# Patient Record
Sex: Female | Born: 1970 | Race: White | Hispanic: No | Marital: Married | Smoking: Never smoker
Health system: Southern US, Community
[De-identification: ages and names within clinical notes are randomized; demographics above are authoritative.]

## PROBLEM LIST (undated history)

## (undated) DIAGNOSIS — N760 Acute vaginitis: Secondary | ICD-10-CM

## (undated) DIAGNOSIS — J40 Bronchitis, not specified as acute or chronic: Secondary | ICD-10-CM

## (undated) DIAGNOSIS — N3010 Interstitial cystitis (chronic) without hematuria: Secondary | ICD-10-CM

## (undated) DIAGNOSIS — Z87440 Personal history of urinary (tract) infections: Secondary | ICD-10-CM

## (undated) DIAGNOSIS — B9689 Other specified bacterial agents as the cause of diseases classified elsewhere: Secondary | ICD-10-CM

## (undated) HISTORY — DX: Personal history of urinary (tract) infections: Z87.440

## (undated) HISTORY — PX: NO PAST SURGERIES: SHX2092

## (undated) HISTORY — DX: Interstitial cystitis (chronic) without hematuria: N30.10

## (undated) HISTORY — DX: Other specified bacterial agents as the cause of diseases classified elsewhere: B96.89

## (undated) HISTORY — DX: Bronchitis, not specified as acute or chronic: J40

## (undated) HISTORY — DX: Acute vaginitis: N76.0

---

## 1998-09-16 ENCOUNTER — Other Ambulatory Visit: Admission: RE | Admit: 1998-09-16 | Discharge: 1998-09-16 | Payer: Self-pay | Admitting: *Deleted

## 2001-03-29 ENCOUNTER — Ambulatory Visit (HOSPITAL_COMMUNITY): Admission: RE | Admit: 2001-03-29 | Discharge: 2001-03-29 | Payer: Self-pay | Admitting: Obstetrics and Gynecology

## 2001-04-05 ENCOUNTER — Ambulatory Visit (HOSPITAL_COMMUNITY): Admission: RE | Admit: 2001-04-05 | Discharge: 2001-04-05 | Payer: Self-pay | Admitting: Obstetrics and Gynecology

## 2001-04-09 ENCOUNTER — Ambulatory Visit (HOSPITAL_COMMUNITY): Admission: AD | Admit: 2001-04-09 | Discharge: 2001-04-09 | Payer: Self-pay | Admitting: Obstetrics and Gynecology

## 2001-04-12 ENCOUNTER — Ambulatory Visit (HOSPITAL_COMMUNITY): Admission: RE | Admit: 2001-04-12 | Discharge: 2001-04-12 | Payer: Self-pay | Admitting: Obstetrics and Gynecology

## 2001-04-16 ENCOUNTER — Ambulatory Visit (HOSPITAL_COMMUNITY): Admission: AD | Admit: 2001-04-16 | Discharge: 2001-04-16 | Payer: Self-pay | Admitting: Obstetrics and Gynecology

## 2001-04-19 ENCOUNTER — Ambulatory Visit (HOSPITAL_COMMUNITY): Admission: RE | Admit: 2001-04-19 | Discharge: 2001-04-19 | Payer: Self-pay | Admitting: Obstetrics and Gynecology

## 2001-04-24 ENCOUNTER — Ambulatory Visit (HOSPITAL_COMMUNITY): Admission: RE | Admit: 2001-04-24 | Discharge: 2001-04-24 | Payer: Self-pay | Admitting: Obstetrics and Gynecology

## 2001-04-26 ENCOUNTER — Ambulatory Visit (HOSPITAL_COMMUNITY): Admission: AD | Admit: 2001-04-26 | Discharge: 2001-04-26 | Payer: Self-pay | Admitting: Obstetrics and Gynecology

## 2001-04-30 ENCOUNTER — Ambulatory Visit (HOSPITAL_COMMUNITY): Admission: RE | Admit: 2001-04-30 | Discharge: 2001-04-30 | Payer: Self-pay | Admitting: Obstetrics and Gynecology

## 2001-05-03 ENCOUNTER — Ambulatory Visit (HOSPITAL_COMMUNITY): Admission: RE | Admit: 2001-05-03 | Discharge: 2001-05-03 | Payer: Self-pay | Admitting: *Deleted

## 2001-05-07 ENCOUNTER — Ambulatory Visit (HOSPITAL_COMMUNITY): Admission: RE | Admit: 2001-05-07 | Discharge: 2001-05-07 | Payer: Self-pay | Admitting: Obstetrics and Gynecology

## 2001-05-10 ENCOUNTER — Ambulatory Visit (HOSPITAL_COMMUNITY): Admission: AD | Admit: 2001-05-10 | Discharge: 2001-05-10 | Payer: Self-pay | Admitting: Obstetrics and Gynecology

## 2001-05-15 ENCOUNTER — Ambulatory Visit (HOSPITAL_COMMUNITY): Admission: AD | Admit: 2001-05-15 | Discharge: 2001-05-15 | Payer: Self-pay | Admitting: Obstetrics and Gynecology

## 2001-05-17 ENCOUNTER — Ambulatory Visit (HOSPITAL_COMMUNITY): Admission: RE | Admit: 2001-05-17 | Discharge: 2001-05-17 | Payer: Self-pay | Admitting: Obstetrics and Gynecology

## 2001-05-22 ENCOUNTER — Ambulatory Visit (HOSPITAL_COMMUNITY): Admission: RE | Admit: 2001-05-22 | Discharge: 2001-05-22 | Payer: Self-pay | Admitting: Obstetrics and Gynecology

## 2001-05-29 ENCOUNTER — Ambulatory Visit (HOSPITAL_COMMUNITY): Admission: AD | Admit: 2001-05-29 | Discharge: 2001-05-29 | Payer: Self-pay | Admitting: Obstetrics and Gynecology

## 2001-05-30 ENCOUNTER — Encounter: Payer: Self-pay | Admitting: Obstetrics and Gynecology

## 2001-05-30 ENCOUNTER — Ambulatory Visit (HOSPITAL_COMMUNITY): Admission: RE | Admit: 2001-05-30 | Discharge: 2001-05-30 | Payer: Self-pay | Admitting: Obstetrics and Gynecology

## 2001-06-05 ENCOUNTER — Ambulatory Visit (HOSPITAL_COMMUNITY): Admission: AD | Admit: 2001-06-05 | Discharge: 2001-06-05 | Payer: Self-pay | Admitting: Obstetrics and Gynecology

## 2001-06-07 ENCOUNTER — Ambulatory Visit (HOSPITAL_COMMUNITY): Admission: RE | Admit: 2001-06-07 | Discharge: 2001-06-07 | Payer: Self-pay | Admitting: Obstetrics and Gynecology

## 2001-06-11 ENCOUNTER — Ambulatory Visit (HOSPITAL_COMMUNITY): Admission: AD | Admit: 2001-06-11 | Discharge: 2001-06-11 | Payer: Self-pay | Admitting: Obstetrics and Gynecology

## 2003-03-20 ENCOUNTER — Encounter: Payer: Self-pay | Admitting: Internal Medicine

## 2003-03-20 ENCOUNTER — Ambulatory Visit (HOSPITAL_COMMUNITY): Admission: RE | Admit: 2003-03-20 | Discharge: 2003-03-20 | Payer: Self-pay | Admitting: Internal Medicine

## 2003-06-26 ENCOUNTER — Ambulatory Visit (HOSPITAL_COMMUNITY): Admission: RE | Admit: 2003-06-26 | Discharge: 2003-06-26 | Payer: Self-pay | Admitting: Internal Medicine

## 2007-09-18 ENCOUNTER — Other Ambulatory Visit: Admission: RE | Admit: 2007-09-18 | Discharge: 2007-09-18 | Payer: Self-pay | Admitting: Obstetrics and Gynecology

## 2008-06-23 ENCOUNTER — Ambulatory Visit (HOSPITAL_COMMUNITY): Admission: RE | Admit: 2008-06-23 | Discharge: 2008-06-23 | Payer: Self-pay | Admitting: Obstetrics & Gynecology

## 2008-12-11 ENCOUNTER — Ambulatory Visit (HOSPITAL_COMMUNITY): Admission: RE | Admit: 2008-12-11 | Discharge: 2008-12-11 | Payer: Self-pay | Admitting: Obstetrics & Gynecology

## 2010-02-22 ENCOUNTER — Other Ambulatory Visit: Admission: RE | Admit: 2010-02-22 | Discharge: 2010-02-22 | Payer: Self-pay | Admitting: Obstetrics and Gynecology

## 2010-12-29 ENCOUNTER — Other Ambulatory Visit (HOSPITAL_COMMUNITY): Payer: Self-pay | Admitting: Internal Medicine

## 2010-12-29 ENCOUNTER — Ambulatory Visit (HOSPITAL_COMMUNITY)
Admission: RE | Admit: 2010-12-29 | Discharge: 2010-12-29 | Disposition: A | Discharge status: Home / Self Care | Payer: 59 | Source: Ambulatory Visit | Attending: Internal Medicine | Admitting: Internal Medicine

## 2010-12-29 DIAGNOSIS — M25519 Pain in unspecified shoulder: Secondary | ICD-10-CM | POA: Insufficient documentation

## 2010-12-29 DIAGNOSIS — M898X1 Other specified disorders of bone, shoulder: Secondary | ICD-10-CM

## 2010-12-29 DIAGNOSIS — R0789 Other chest pain: Secondary | ICD-10-CM | POA: Insufficient documentation

## 2011-04-08 NOTE — Op Note (Signed)
NAME:  Terri Tyler, Terri Tyler                        ACCOUNT NO.:  [id number]   MEDICAL RECORD NO.:  [id number]                   PATIENT TYPE:  AMB   LOCATION:  DAY                                  FACILITY:  [facility]   PHYSICIAN:  Lionel December, M.D.                 DATE OF BIRTH:  07-25-1971   DATE OF PROCEDURE:  06/27/2003  DATE OF DISCHARGE:                                 OPERATIVE REPORT   PROCEDURE:  Esophagogastroduodenoscopy.   INDICATIONS:  Terri Tyler is a 40 year old Caucasian female with history of  heartburn who is also having retrosternal chest pain radiating to her neck  and arm.  She has been on Prilosec but symptoms have not resolved.  She had  a normal EKG and echocardiography.  She is undergoing diagnostic EGD.  The  procedure risks were reviewed with the patient and informed consent was  obtained.   PREMEDICATION:  Cetacaine spray for pharyngeal topical anesthesia, Demerol  50 mg IV, Versed 10 mg IV in divided dose.   FINDINGS:  Procedure performed in endoscopy suite.  The patient's vital  signs and O2 saturation were monitored during the procedure and remained  stable.  The patient was placed in the left lateral recumbent position and  the Olympus video scope was passed via oropharynx without any difficulty  into the esophagus.   Esophagus:  Mucosa of the esophagus was normal.  The squamocolumnar junction  was very wavy or serrated.  There was a 3 cm long sliding hiatal hernia.  No  ring or stricture was noted.   Stomach:  It was empty and distended very well with insufflation.  Folds of  the proximal stomach were normal.  Examination of the mucosa at body,  antrum, pyloric channel, as well as angularis, fundus, and cardia, was  normal.   Duodenum:  Examination of the bulb reveals normal mucosa.  The scope was  passed to the second part of the duodenum.  The mucosa and folds are normal.  The endoscope was withdrawn.  The patient tolerated the procedure well.   FINAL DIAGNOSIS:  Small sliding hiatal hernia, otherwise normal EGD.   Cannot rule out chest pain secondary to gastroesophageal reflux.   RECOMMENDATIONS:  1. Antireflux measured reinforced.  She definitely has room for improvement.  2. Will stop her Prilosec and start her on Nexium 40 mg p.o. q.a.m.  3.     If she does not improve significantly with these measures, would consider     upper abdominal ultrasound and esophageal manometry and a pH study.  4. The patient will call with a progress report in two weeks from now.                                               [employer],  M.D.    NR/MEDQ  D:  06/26/2003  T:  06/27/2003  Job:  [id number]   cc:   Kingsley Callander. Ouida Sills, M.D.  [address]  [place]  [place] [phone]  Fax: [phone]

## 2011-12-12 ENCOUNTER — Other Ambulatory Visit (HOSPITAL_COMMUNITY)
Admission: RE | Admit: 2011-12-12 | Discharge: 2011-12-12 | Disposition: A | Discharge status: Home / Self Care | Payer: 59 | Source: Ambulatory Visit | Attending: Obstetrics & Gynecology | Admitting: Obstetrics & Gynecology

## 2011-12-12 DIAGNOSIS — Z01419 Encounter for gynecological examination (general) (routine) without abnormal findings: Secondary | ICD-10-CM | POA: Insufficient documentation

## 2013-02-05 ENCOUNTER — Other Ambulatory Visit: Payer: Self-pay | Admitting: Obstetrics & Gynecology

## 2013-03-13 ENCOUNTER — Encounter: Payer: Self-pay | Admitting: *Deleted

## 2013-03-14 ENCOUNTER — Encounter: Payer: Self-pay | Admitting: Obstetrics & Gynecology

## 2013-03-14 ENCOUNTER — Ambulatory Visit (INDEPENDENT_AMBULATORY_CARE_PROVIDER_SITE_OTHER): Payer: BC Managed Care – HMO | Admitting: Obstetrics & Gynecology

## 2013-03-14 VITALS — BP 128/78 | Ht 64.0 in | Wt 276.0 lb

## 2013-03-14 DIAGNOSIS — N3010 Interstitial cystitis (chronic) without hematuria: Secondary | ICD-10-CM

## 2013-03-14 MED ORDER — PREDNISONE (PAK) 10 MG PO TABS: 10.0000 mg | ORAL_TABLET | Freq: Every day | ORAL | Status: DC

## 2013-03-14 MED ORDER — FEXOFENADINE-PSEUDOEPHED ER 180-240 MG PO TB24: 1.0000 | ORAL_TABLET | Freq: Every day | ORAL | Status: DC

## 2013-03-14 NOTE — Progress Notes (Signed)
Patient ID: Terri Tyler, female   DOB: Apr 20, 1971, 42 y.o.   MRN: [id number] DMSO bladder irrigation performed Allergies are really flaring up Prednisone x 10 days Zyrtec d Allergy drops for eye

## 2013-03-14 NOTE — Patient Instructions (Addendum)

## 2013-03-18 ENCOUNTER — Telehealth: Payer: Self-pay | Admitting: Adult Health

## 2013-03-18 ENCOUNTER — Telehealth: Payer: Self-pay | Admitting: Obstetrics & Gynecology

## 2013-03-18 NOTE — Telephone Encounter (Signed)
Invalid number

## 2013-03-18 NOTE — Telephone Encounter (Signed)
Left message I called 

## 2013-03-18 NOTE — Telephone Encounter (Signed)
Pt called and wants antibiotic for cold and to say the prednisone keeps her awake. Told her not to take prednisone in am and to talk with Dr. Despina Hidden in am about the antibiotic and prednisone, she said ok.

## 2013-03-19 NOTE — Telephone Encounter (Signed)
No antibiotic needed its allergies

## 2013-04-10 ENCOUNTER — Ambulatory Visit: Payer: BC Managed Care – HMO | Admitting: Obstetrics & Gynecology

## 2013-05-06 ENCOUNTER — Ambulatory Visit (INDEPENDENT_AMBULATORY_CARE_PROVIDER_SITE_OTHER): Payer: BC Managed Care – HMO | Admitting: Obstetrics & Gynecology

## 2013-05-06 ENCOUNTER — Encounter: Payer: Self-pay | Admitting: Obstetrics & Gynecology

## 2013-05-06 VITALS — BP 110/80 | Wt 280.0 lb

## 2013-05-06 DIAGNOSIS — N3010 Interstitial cystitis (chronic) without hematuria: Secondary | ICD-10-CM

## 2013-05-06 MED ORDER — UROGESIC-BLUE 81.6 MG PO TABS: 1.0000 | ORAL_TABLET | Freq: Three times a day (TID) | ORAL | Status: DC

## 2013-05-06 NOTE — Progress Notes (Signed)
Patient ID: Terri Tyler, female   DOB: 1971/11/14, 42 y.o.   MRN: [id number] Long standing interstitial cystitis in today for her periodic maintainence bladder irrigation Last time 3 weeks ago  DMSO irrigation preformed in usual fashion, 50 cc total  Follow up in 2 weeks

## 2013-05-27 ENCOUNTER — Ambulatory Visit (INDEPENDENT_AMBULATORY_CARE_PROVIDER_SITE_OTHER): Payer: BC Managed Care – HMO | Admitting: Obstetrics & Gynecology

## 2013-05-27 ENCOUNTER — Encounter: Payer: Self-pay | Admitting: Obstetrics & Gynecology

## 2013-05-27 VITALS — BP 110/80 | Wt 227.0 lb

## 2013-05-27 DIAGNOSIS — N3010 Interstitial cystitis (chronic) without hematuria: Secondary | ICD-10-CM

## 2013-05-27 NOTE — Patient Instructions (Signed)

## 2013-05-27 NOTE — Progress Notes (Signed)
Patient ID: Terri Tyler, female   DOB: 1971-01-04, 42 y.o.   MRN: [id number] Terri Tyler is in today for a routine DMSO irrigation She had her last one 3 weeks ago  She is having some complaints She is taking her urelle  50 cc of DMSO is instilled today without difficulty  We will see her back in 2 weeks for reinstallation per her request

## 2013-06-11 ENCOUNTER — Ambulatory Visit: Payer: BC Managed Care – HMO | Admitting: Obstetrics & Gynecology

## 2013-06-11 ENCOUNTER — Encounter: Payer: Self-pay | Admitting: *Deleted

## 2013-06-12 ENCOUNTER — Ambulatory Visit: Payer: BC Managed Care – HMO | Admitting: Obstetrics & Gynecology

## 2013-07-04 ENCOUNTER — Ambulatory Visit: Payer: BC Managed Care – HMO | Admitting: Obstetrics & Gynecology

## 2013-07-09 ENCOUNTER — Ambulatory Visit: Payer: BC Managed Care – HMO | Admitting: Obstetrics & Gynecology

## 2013-09-10 ENCOUNTER — Ambulatory Visit: Payer: BC Managed Care – HMO | Admitting: Obstetrics & Gynecology

## 2013-11-18 ENCOUNTER — Other Ambulatory Visit (HOSPITAL_COMMUNITY): Payer: Self-pay | Admitting: Internal Medicine

## 2013-11-18 DIAGNOSIS — Z139 Encounter for screening, unspecified: Secondary | ICD-10-CM

## 2013-11-19 ENCOUNTER — Ambulatory Visit (HOSPITAL_COMMUNITY)
Admission: RE | Admit: 2013-11-19 | Discharge: 2013-11-19 | Disposition: A | Discharge status: Home / Self Care | Payer: BC Managed Care – PPO | Source: Ambulatory Visit | Attending: Internal Medicine | Admitting: Internal Medicine

## 2013-11-19 DIAGNOSIS — Z139 Encounter for screening, unspecified: Secondary | ICD-10-CM

## 2013-11-19 DIAGNOSIS — Z1231 Encounter for screening mammogram for malignant neoplasm of breast: Secondary | ICD-10-CM | POA: Insufficient documentation

## 2014-02-25 ENCOUNTER — Ambulatory Visit (INDEPENDENT_AMBULATORY_CARE_PROVIDER_SITE_OTHER): Payer: BC Managed Care – HMO | Admitting: Obstetrics & Gynecology

## 2014-02-25 ENCOUNTER — Encounter: Payer: Self-pay | Admitting: Obstetrics & Gynecology

## 2014-02-25 VITALS — BP 120/80 | Ht 63.0 in | Wt 269.0 lb

## 2014-02-25 DIAGNOSIS — N3010 Interstitial cystitis (chronic) without hematuria: Secondary | ICD-10-CM

## 2014-02-25 MED ORDER — PENTOSAN POLYSULFATE SODIUM 100 MG PO CAPS: ORAL_CAPSULE | ORAL | Status: DC

## 2014-02-25 NOTE — Progress Notes (Signed)
Patient ID: Terri Tyler, female   DOB: 12/07/1970, 43 y.o.   MRN: [id number] Pt has not been in since 05/27/2013 for DMSO Pt symptoms have returned due to allergy season  50 cc of DMSO instilled  Follow up 2 months \ Past Medical History  Diagnosis Date  . Interstitial cystitis   . Bronchitis   . BV (bacterial vaginosis)   . History of UTI     Past Surgical History  Procedure Laterality Date  . No past surgeries      OB History   Grav Para Term Preterm Abortions TAB SAB Ect Mult Living   3 3 2      1 3       Allergies  Allergen Reactions  . Other     novacaine-dizziness     History   Social History  . Marital Status: Married    Spouse Name: N/A    Number of Children: N/A  . Years of Education: N/A   Social History Main Topics  . Smoking status: Never Smoker   . Smokeless tobacco: None  . Alcohol Use: No  . Drug Use: No  . Sexual Activity: Yes   Other Topics Concern  . None   Social History Narrative  . None    Family History  Problem Relation Age of Onset  . Colon cancer Paternal Grandfather   . Diabetes Paternal Grandmother   . Colon cancer Maternal Grandmother   . Hypertension Father

## 2014-02-28 ENCOUNTER — Other Ambulatory Visit: Payer: Self-pay | Admitting: Obstetrics & Gynecology

## 2014-04-02 ENCOUNTER — Ambulatory Visit (HOSPITAL_COMMUNITY)
Admission: RE | Admit: 2014-04-02 | Discharge: 2014-04-02 | Disposition: A | Discharge status: Home / Self Care | Payer: BC Managed Care – PPO | Source: Ambulatory Visit | Attending: Pulmonary Disease | Admitting: Pulmonary Disease

## 2014-04-02 ENCOUNTER — Other Ambulatory Visit (HOSPITAL_COMMUNITY): Payer: Self-pay | Admitting: Pulmonary Disease

## 2014-04-02 DIAGNOSIS — R200 Anesthesia of skin: Secondary | ICD-10-CM

## 2014-04-02 DIAGNOSIS — R209 Unspecified disturbances of skin sensation: Secondary | ICD-10-CM | POA: Insufficient documentation

## 2014-04-02 DIAGNOSIS — R030 Elevated blood-pressure reading, without diagnosis of hypertension: Secondary | ICD-10-CM | POA: Insufficient documentation

## 2014-04-02 DIAGNOSIS — R531 Weakness: Secondary | ICD-10-CM

## 2014-04-02 DIAGNOSIS — R5381 Other malaise: Secondary | ICD-10-CM | POA: Insufficient documentation

## 2014-04-02 DIAGNOSIS — R5383 Other fatigue: Secondary | ICD-10-CM

## 2014-04-04 ENCOUNTER — Telehealth: Payer: Self-pay | Admitting: Obstetrics & Gynecology

## 2014-04-04 MED ORDER — CITALOPRAM HYDROBROMIDE 40 MG PO TABS: ORAL_TABLET | ORAL | Status: DC

## 2014-04-04 NOTE — Telephone Encounter (Signed)
Pt requesting Celexa 20 mg to be increased to 40 mg due to increased anxiety or does pt need an appt. Next appt for bladder irrigation 04/28/2014.

## 2014-04-04 NOTE — Telephone Encounter (Signed)
[facility] [place].

## 2014-04-28 ENCOUNTER — Ambulatory Visit: Payer: BC Managed Care – HMO | Admitting: Obstetrics & Gynecology

## 2014-05-21 ENCOUNTER — Ambulatory Visit: Payer: BC Managed Care – HMO | Admitting: Obstetrics & Gynecology

## 2014-05-28 ENCOUNTER — Ambulatory Visit: Payer: BC Managed Care – HMO | Admitting: Obstetrics & Gynecology

## 2014-06-03 ENCOUNTER — Encounter: Payer: Self-pay | Admitting: Obstetrics & Gynecology

## 2014-06-03 ENCOUNTER — Ambulatory Visit (INDEPENDENT_AMBULATORY_CARE_PROVIDER_SITE_OTHER): Payer: BC Managed Care – HMO | Admitting: Obstetrics & Gynecology

## 2014-06-03 VITALS — BP 120/80

## 2014-06-03 DIAGNOSIS — N3010 Interstitial cystitis (chronic) without hematuria: Secondary | ICD-10-CM

## 2014-06-03 NOTE — Progress Notes (Signed)
Patient ID: Cori RazorYvette N Cloe, female   DOB: 05/30/1971, 43 y.o.   MRN: [id number] Patient ID: Cori RazorYvette N Piggott, female   DOB: 05/20/1971, 43 y.o.   MRN: [id number] Pt has not been in since 05/27/2013 for DMSO Pt symptoms have returned due to allergy season  50 cc of DMSO instilled  Follow up 3 months \ Past Medical History  Diagnosis Date  . Interstitial cystitis   . Bronchitis   . BV (bacterial vaginosis)   . History of UTI     Past Surgical History  Procedure Laterality Date  . No past surgeries      OB History   Grav Para Term Preterm Abortions TAB SAB Ect Mult Living   3 3 2      1 3       Allergies  Allergen Reactions  . Other     novacaine-dizziness     History   Social History  . Marital Status: Married    Spouse Name: N/A    Number of Children: N/A  . Years of Education: N/A   Social History Main Topics  . Smoking status: Never Smoker   . Smokeless tobacco: None  . Alcohol Use: No  . Drug Use: No  . Sexual Activity: Yes   Other Topics Concern  . None   Social History Narrative  . None    Family History  Problem Relation Age of Onset  . Colon cancer Paternal Grandfather   . Diabetes Paternal Grandmother   . Colon cancer Maternal Grandmother   . Hypertension Father

## 2014-07-02 ENCOUNTER — Ambulatory Visit (INDEPENDENT_AMBULATORY_CARE_PROVIDER_SITE_OTHER): Payer: BC Managed Care – HMO | Admitting: Obstetrics & Gynecology

## 2014-07-02 ENCOUNTER — Encounter: Payer: Self-pay | Admitting: Obstetrics & Gynecology

## 2014-07-02 VITALS — BP 102/70 | Wt 268.0 lb

## 2014-07-02 DIAGNOSIS — B3731 Acute candidiasis of vulva and vagina: Secondary | ICD-10-CM

## 2014-07-02 DIAGNOSIS — B373 Candidiasis of vulva and vagina: Secondary | ICD-10-CM

## 2014-07-02 MED ORDER — FLUCONAZOLE 100 MG PO TABS: 100.0000 mg | ORAL_TABLET | Freq: Every day | ORAL | Status: DC

## 2014-07-02 NOTE — Progress Notes (Signed)
Patient ID: Terri Tyler, female   DOB: 12/02/1970, 43 y.o.   MRN: [id number] Having vaginal burning and itching No abx  Exam +yeast vaginally GV placed  Will use diflucan for 1 week Follow up prn or for irrigation

## 2014-09-03 ENCOUNTER — Ambulatory Visit: Payer: BC Managed Care – HMO | Admitting: Obstetrics & Gynecology

## 2014-09-18 ENCOUNTER — Ambulatory Visit: Payer: BC Managed Care – HMO | Admitting: Obstetrics & Gynecology

## 2014-09-22 ENCOUNTER — Encounter: Payer: Self-pay | Admitting: Obstetrics & Gynecology

## 2014-11-17 ENCOUNTER — Other Ambulatory Visit (HOSPITAL_COMMUNITY): Payer: Self-pay | Admitting: Internal Medicine

## 2014-11-17 DIAGNOSIS — Z1231 Encounter for screening mammogram for malignant neoplasm of breast: Secondary | ICD-10-CM

## 2014-11-18 ENCOUNTER — Ambulatory Visit (INDEPENDENT_AMBULATORY_CARE_PROVIDER_SITE_OTHER): Payer: BC Managed Care – HMO | Admitting: Obstetrics & Gynecology

## 2014-11-18 ENCOUNTER — Encounter: Payer: Self-pay | Admitting: Obstetrics & Gynecology

## 2014-11-18 VITALS — BP 126/80 | Temp 99.3°F | Ht 63.0 in | Wt 270.0 lb

## 2014-11-18 DIAGNOSIS — B373 Candidiasis of vulva and vagina: Secondary | ICD-10-CM

## 2014-11-18 DIAGNOSIS — B3731 Acute candidiasis of vulva and vagina: Secondary | ICD-10-CM

## 2014-11-18 DIAGNOSIS — M545 Low back pain, unspecified: Secondary | ICD-10-CM

## 2014-11-18 DIAGNOSIS — N3010 Interstitial cystitis (chronic) without hematuria: Secondary | ICD-10-CM

## 2014-11-18 MED ORDER — TERCONAZOLE 0.4 % VA CREA: 1.0000 | TOPICAL_CREAM | Freq: Every day | VAGINAL | Status: DC

## 2014-11-18 MED ORDER — PENTOSAN POLYSULFATE SODIUM 100 MG PO CAPS: ORAL_CAPSULE | ORAL | Status: DC

## 2014-11-18 MED ORDER — CYCLOBENZAPRINE HCL 10 MG PO TABS: 10.0000 mg | ORAL_TABLET | Freq: Three times a day (TID) | ORAL | Status: DC | PRN

## 2014-11-18 NOTE — Progress Notes (Signed)
Patient ID: Cori RazorYvette N Pastrana, female   DOB: 07/14/1971, 43 y.o.   MRN: [id number] Here for 3 problems:  #1:  Lower back pain, achiness worse the past few days:  Triggers in the triangle of low back paraspionous and ischial spine,  Trigger Point Injection   Pre-operative diagnosis: myofascial pain  Post-operative diagnosis: myofascial pain  After risks and benefits were explained including bleeding, infection, worsening of the pain, damage to the area being injected, weakness, allergic reaction to medications, vascular injection, and nerve damage, signed consent was obtained.  All questions were answered.    The area of the trigger point was identified and the skin prepped three times with alcohol and the alcohol allowed to dry.  Next, a 25 gauge 1.5 inch needle was placed in the area of the trigger point.  Once reproduction of the pain was elicited and negative aspiration confirmed, the trigger point was injected and the needle removed.    The patient did tolerate the procedure well and there were not complications.    Medication used: 20 mg 0.5% bupivicaine;     Trigger points injected: 2    Trigger point(s) location(s):  bilateral   #2  Low grade fever without any isolated symptoms, no GI GU Upper esp sx  #3  Burning finished antibioitcs about 2 weeks ago;  Hard to tell on exam bleeding GV place will trat with terazol

## 2014-11-20 ENCOUNTER — Ambulatory Visit (HOSPITAL_COMMUNITY): Payer: BC Managed Care – PPO

## 2014-11-20 ENCOUNTER — Ambulatory Visit: Payer: BC Managed Care – HMO | Admitting: Obstetrics & Gynecology

## 2014-12-04 ENCOUNTER — Encounter: Payer: Self-pay | Admitting: Obstetrics & Gynecology

## 2014-12-04 ENCOUNTER — Ambulatory Visit (INDEPENDENT_AMBULATORY_CARE_PROVIDER_SITE_OTHER): Payer: BLUE CROSS/BLUE SHIELD | Admitting: Obstetrics & Gynecology

## 2014-12-04 VITALS — BP 118/80 | Ht 64.0 in | Wt 265.0 lb

## 2014-12-04 DIAGNOSIS — B9689 Other specified bacterial agents as the cause of diseases classified elsewhere: Secondary | ICD-10-CM

## 2014-12-04 DIAGNOSIS — A499 Bacterial infection, unspecified: Secondary | ICD-10-CM

## 2014-12-04 DIAGNOSIS — R300 Dysuria: Secondary | ICD-10-CM

## 2014-12-04 DIAGNOSIS — N760 Acute vaginitis: Secondary | ICD-10-CM

## 2014-12-04 LAB — POCT URINALYSIS DIPSTICK
Blood, UA: NEGATIVE
GLUCOSE UA: NEGATIVE
Ketones, UA: NEGATIVE
Leukocytes, UA: NEGATIVE
NITRITE UA: NEGATIVE
Protein, UA: NEGATIVE

## 2014-12-04 MED ORDER — METRONIDAZOLE 0.75 % VA GEL: VAGINAL | Status: DC

## 2014-12-04 NOTE — Progress Notes (Signed)
Patient ID: Terri Tyler, female   DOB: 02/25/1971, 44 y.o.   MRN: [id number] Chief Complaint  Patient presents with  . urinary frequency, burning, clear vaginal discharge    HPI Pt has mono Complaining of vaginal discharge with burning and also urinary urgency and frequency UA today negative  ROS No burning with urination, frequency or urgency No nausea, vomiting or diarrhea + fever chills or other constitutional symptoms   Blood pressure 118/80, height 5\' 4"  (1.626 m), weight 265 lb (120.203 kg), last menstrual period 11/18/2014.  EXAM Abdomen:       Vulva:            normal appearing vulva with no masses, tenderness or lesions Vagina:          normal mucosa, thin grey discharge Cervix:           normal appearance Uterus:           Adnexa:          Rectal:            Hemoccult:                               Assessment/Plan:  BV  metrogel qhs x 5 Culture urine

## 2014-12-04 NOTE — Addendum Note (Signed)
Addended by: Criss AlvinePULLIAM, Akelia Husted G on: 12/04/2014 12:50 PM   Modules accepted: Orders

## 2014-12-06 LAB — URINE CULTURE
Colony Count: NO GROWTH
Organism ID, Bacteria: NO GROWTH

## 2015-01-20 ENCOUNTER — Ambulatory Visit: Payer: BC Managed Care – HMO | Admitting: Obstetrics & Gynecology

## 2015-01-21 ENCOUNTER — Ambulatory Visit: Payer: BLUE CROSS/BLUE SHIELD | Admitting: Obstetrics & Gynecology

## 2015-01-29 ENCOUNTER — Ambulatory Visit: Payer: BLUE CROSS/BLUE SHIELD | Admitting: Obstetrics & Gynecology

## 2015-02-16 ENCOUNTER — Other Ambulatory Visit: Payer: Self-pay | Admitting: Obstetrics & Gynecology

## 2015-02-16 DIAGNOSIS — Z1231 Encounter for screening mammogram for malignant neoplasm of breast: Secondary | ICD-10-CM

## 2015-02-17 ENCOUNTER — Telehealth: Payer: Self-pay | Admitting: *Deleted

## 2015-02-17 ENCOUNTER — Telehealth: Payer: Self-pay | Admitting: Obstetrics & Gynecology

## 2015-02-17 MED ORDER — QUETIAPINE FUMARATE ER 200 MG PO TB24: 200.0000 mg | ORAL_TABLET | Freq: Every day | ORAL | Status: DC

## 2015-02-17 NOTE — Telephone Encounter (Signed)
Pt aware Seroquel e-scribed.

## 2015-02-17 NOTE — Telephone Encounter (Signed)
Pt requesting Dr. Despina HiddenEure to prescribe Seroquel XR 200 mg.  Pt states no longer taking Celexa, was changed to Seroquel 300 mg by another provider called that providers office and they will not give pt refill without being seen and cannot see her for 4 days. Pt states has been without Seroquel for 1 day already.

## 2015-02-19 ENCOUNTER — Encounter: Payer: Self-pay | Admitting: Obstetrics & Gynecology

## 2015-02-19 ENCOUNTER — Ambulatory Visit (INDEPENDENT_AMBULATORY_CARE_PROVIDER_SITE_OTHER): Payer: BC Managed Care – PPO | Admitting: Obstetrics & Gynecology

## 2015-02-19 ENCOUNTER — Ambulatory Visit (HOSPITAL_COMMUNITY): Payer: BC Managed Care – PPO

## 2015-02-19 VITALS — BP 110/70 | HR 96 | Wt 274.0 lb

## 2015-02-19 DIAGNOSIS — N3010 Interstitial cystitis (chronic) without hematuria: Secondary | ICD-10-CM

## 2015-02-19 NOTE — Progress Notes (Signed)
Patient ID: Terri Tyler, female   DOB: 05/19/1971, 44 y.o.   MRN: [id number] Diagnosed with IC: years ago  Current Meds:   Current outpatient prescriptions:  Marland Kitchen.  Methen-Hyosc-Meth Blue-Na Phos (UROGESIC-BLUE) 81.6 MG TABS, Take 1 tablet (81.6 mg total) by mouth 3 (three) times daily., Disp: 90 tablet, Rfl: 11 .  QUEtiapine (SEROQUEL XR) 200 MG 24 hr tablet, Take 1 tablet (200 mg total) by mouth at bedtime., Disp: 30 tablet, Rfl: 0 .  citalopram (CELEXA) 40 MG tablet, TAKE 1 TABLET BY MOUTH ONCE DAILY. (Patient not taking: Reported on 02/19/2015), Disp: 30 tablet, Rfl: 11 .  cyclobenzaprine (FLEXERIL) 10 MG tablet, Take 1 tablet (10 mg total) by mouth every 8 (eight) hours as needed for muscle spasms. (Patient not taking: Reported on 12/04/2014), Disp: 30 tablet, Rfl: 1 .  fexofenadine-pseudoephedrine (ALLEGRA-D 24 HOUR) 180-240 MG per 24 hr tablet, Take 1 tablet by mouth daily. (Patient not taking: Reported on 11/18/2014), Disp: 30 tablet, Rfl: 1 .  fluconazole (DIFLUCAN) 100 MG tablet, Take 1 tablet (100 mg total) by mouth daily. (Patient not taking: Reported on 11/18/2014), Disp: 7 tablet, Rfl: 0 .  metroNIDAZOLE (METROGEL VAGINAL) 0.75 % vaginal gel, Nightly x 5 nights (Patient not taking: Reported on 02/19/2015), Disp: 70 g, Rfl: 0 .  pentosan polysulfate (ELMIRON) 100 MG capsule, Take 2 tablets twice daily (Patient not taking: Reported on 02/19/2015), Disp: 120 capsule, Rfl: 11 .  predniSONE (STERAPRED UNI-PAK) 10 MG tablet, Take 1 tablet (10 mg total) by mouth daily. Take 4 tablets a day for 10 days (Patient not taking: Reported on 11/18/2014), Disp: 40 tablet, Rfl: 0 .  terconazole (TERAZOL 7) 0.4 % vaginal cream, Place 1 applicator vaginally at bedtime. (Patient not taking: Reported on 12/04/2014), Disp: 45 g, Rfl: 0 .  URELLE (URELLE/URISED) 81 MG TABS tablet, Take 1 tablet by mouth every 6 (six) hours as needed for bladder spasms., Disp: , Rfl:  Dietary restrictions    Pt states her  symptoms have Tyler stable, no exacerbations, although not perfect Wants to continue on this cycle  Blood pressure 110/70, pulse 96, weight 274 lb (124.286 kg), last menstrual period 01/29/2015.    The external urethra meatus was prepped with betadine DMSO 50 cc was instilled in the usual fashion after the bladder was catheterized and emptied completely 50cc was instilled into the bladder without difficulty and the patient tolerated well She will refrain from voiding as long as possible  Follow up in 6 weeks, or as patient requests based on her symptom complex

## 2015-02-20 ENCOUNTER — Ambulatory Visit: Payer: BLUE CROSS/BLUE SHIELD | Admitting: Obstetrics & Gynecology

## 2015-04-02 ENCOUNTER — Ambulatory Visit: Payer: BC Managed Care – PPO | Admitting: Obstetrics & Gynecology

## 2015-05-21 ENCOUNTER — Encounter: Payer: Self-pay | Admitting: Obstetrics & Gynecology

## 2015-05-21 ENCOUNTER — Ambulatory Visit (INDEPENDENT_AMBULATORY_CARE_PROVIDER_SITE_OTHER): Payer: BC Managed Care – PPO | Admitting: Obstetrics & Gynecology

## 2015-05-21 VITALS — BP 110/70 | HR 88 | Wt 276.0 lb

## 2015-05-21 DIAGNOSIS — N3010 Interstitial cystitis (chronic) without hematuria: Secondary | ICD-10-CM | POA: Diagnosis not present

## 2015-05-21 MED ORDER — NYSTATIN-TRIAMCINOLONE 100000-0.1 UNIT/GM-% EX OINT: 1.0000 "application " | TOPICAL_OINTMENT | Freq: Two times a day (BID) | CUTANEOUS | Status: DC

## 2015-05-21 NOTE — Progress Notes (Signed)
Patient ID: Terri RazorYvette N Priest, female   DOB: 10/03/1971, 44 y.o.   MRN: [id number] Diagnosed with IC: few years  Current Meds:  DMSO, urogesic Dietary restrictions    Pt states her symptoms have been stable, no exacerbations, although not perfect Wants to continue on this cycle  Blood pressure 110/70, pulse 88, weight 276 lb (125.193 kg), last menstrual period 05/07/2015.    The external urethra meatus was prepped with betadine DMSO 50 cc was instilled in the usual fashion after the bladder was catheterized and emptied completely 50cc was instilled into the bladder without difficulty and the patient tolerated well She will refrain from voiding as long as possible  Follow up in 8 weeks, or as patient requests based on her symptom complex

## 2015-07-07 ENCOUNTER — Telehealth: Payer: Self-pay | Admitting: *Deleted

## 2015-07-07 ENCOUNTER — Encounter: Payer: Self-pay | Admitting: Obstetrics & Gynecology

## 2015-07-07 ENCOUNTER — Ambulatory Visit: Payer: BC Managed Care – PPO | Admitting: Obstetrics & Gynecology

## 2015-07-08 ENCOUNTER — Other Ambulatory Visit (INDEPENDENT_AMBULATORY_CARE_PROVIDER_SITE_OTHER): Payer: BC Managed Care – PPO | Admitting: *Deleted

## 2015-07-08 DIAGNOSIS — R319 Hematuria, unspecified: Secondary | ICD-10-CM | POA: Diagnosis not present

## 2015-07-08 LAB — POCT URINALYSIS DIPSTICK
Glucose, UA: NEGATIVE
Ketones, UA: NEGATIVE
NITRITE UA: POSITIVE

## 2015-07-08 NOTE — Progress Notes (Signed)
Pt was here today complaining of blood in urine.  Urine specimen obtained and dipped and culture ordered.

## 2015-07-09 ENCOUNTER — Telehealth: Payer: Self-pay | Admitting: *Deleted

## 2015-07-09 NOTE — Telephone Encounter (Signed)
Dr. Despina Hidden,, [name] missed an appointment with you on Tuesday, she called Tuesday afternoon and spoke with Chrystal about having blood in her urine.  She was told that we could not get her in to be seen that afternoon but she could come by and leave a urine specimen and we would dip it and send out a CX if the dip showed anything.  She came by yesterday and we did the UA and sent out the CX, she is wanting an abx sent to her pharmacy.  I told her when we got the results back from the CX she would here back from [place] about a medication.

## 2015-07-09 NOTE — Telephone Encounter (Signed)
Pt informed Dr. Despina Hidden aware of POCT UA from 07/08/2015 + nitrates, urine culture still pending. Dr. Despina Hidden is going to wait for culture results and prescribe ABX results.  Pt verbalized understanding.

## 2015-07-10 ENCOUNTER — Telehealth: Payer: Self-pay | Admitting: Obstetrics & Gynecology

## 2015-07-10 LAB — URINE CULTURE

## 2015-07-10 MED ORDER — CIPROFLOXACIN HCL 500 MG PO TABS: 500.0000 mg | ORAL_TABLET | Freq: Two times a day (BID) | ORAL | Status: DC

## 2015-07-11 NOTE — Telephone Encounter (Signed)
Pt was placed on sensitive antibiotic Pt is aware

## 2015-07-20 ENCOUNTER — Ambulatory Visit: Payer: BC Managed Care – PPO | Admitting: Obstetrics & Gynecology

## 2015-07-30 ENCOUNTER — Ambulatory Visit (INDEPENDENT_AMBULATORY_CARE_PROVIDER_SITE_OTHER): Payer: BC Managed Care – PPO | Admitting: Obstetrics & Gynecology

## 2015-07-30 ENCOUNTER — Encounter: Payer: Self-pay | Admitting: Obstetrics & Gynecology

## 2015-07-30 VITALS — BP 100/60 | HR 76 | Wt 286.4 lb

## 2015-07-30 DIAGNOSIS — N3010 Interstitial cystitis (chronic) without hematuria: Secondary | ICD-10-CM | POA: Diagnosis not present

## 2015-07-30 DIAGNOSIS — R300 Dysuria: Secondary | ICD-10-CM | POA: Diagnosis not present

## 2015-07-30 NOTE — Progress Notes (Signed)
Patient ID: Terri Tyler DOB: 1970/12/03, 44 y.o.   MRN: [id number] Diagnosed with IC: several years  Current Meds:  Elmiron, DMSO Dietary restrictions    Pt states her symptoms have been stable, no exacerbations, although not perfect Wants to continue on this cycle  Blood pressure 100/60, pulse 76, weight 286 lb 6.4 oz (129.91 kg), last menstrual period 07/24/2015.    The external urethra meatus was prepped with betadine DMSO 50 cc was instilled in the usual fashion after the bladder was catheterized and emptied completely 50cc was instilled into the bladder without difficulty and the patient tolerated well She will refrain from voiding as long as possible  Follow up in 6 weeks, or as patient requests based on her symptom complex  Chronic interstitial cystitis  Return in about 6 weeks (around 09/10/2015) for bladder irrigation, with Dr Despina Hidden.

## 2015-07-30 NOTE — Addendum Note (Signed)
Addended by: Federico Flake A on: 07/30/2015 04:48 PM   Modules accepted: Orders

## 2015-08-01 LAB — URINE CULTURE: Organism ID, Bacteria: NO GROWTH

## 2015-09-10 ENCOUNTER — Ambulatory Visit: Payer: BC Managed Care – PPO | Admitting: Obstetrics & Gynecology

## 2015-09-22 ENCOUNTER — Telehealth: Payer: Self-pay | Admitting: *Deleted

## 2015-09-22 ENCOUNTER — Telehealth: Payer: Self-pay | Admitting: Obstetrics & Gynecology

## 2015-09-22 MED ORDER — DIMETHYL SULFOXIDE 50 % IS SOLN: INTRAVESICAL | Status: DC

## 2015-09-29 ENCOUNTER — Ambulatory Visit: Payer: BC Managed Care – PPO | Admitting: Obstetrics & Gynecology

## 2015-10-01 ENCOUNTER — Encounter: Payer: Self-pay | Admitting: *Deleted

## 2015-10-30 ENCOUNTER — Other Ambulatory Visit: Payer: Self-pay | Admitting: Obstetrics & Gynecology

## 2015-11-02 ENCOUNTER — Telehealth: Payer: Self-pay | Admitting: *Deleted

## 2015-11-02 ENCOUNTER — Other Ambulatory Visit: Payer: Self-pay | Admitting: Obstetrics & Gynecology

## 2015-11-02 MED ORDER — URELLE 81 MG PO TABS: 1.0000 | ORAL_TABLET | Freq: Four times a day (QID) | ORAL | Status: DC | PRN

## 2015-11-02 NOTE — Telephone Encounter (Signed)
Requesting a refill on Urelle. RX for Lexapro done.

## 2015-11-11 ENCOUNTER — Ambulatory Visit: Payer: BC Managed Care – PPO | Admitting: Obstetrics & Gynecology

## 2015-11-19 ENCOUNTER — Ambulatory Visit (HOSPITAL_COMMUNITY)
Admission: RE | Admit: 2015-11-19 | Discharge: 2015-11-19 | Disposition: A | Discharge status: Home / Self Care | Payer: BC Managed Care – PPO | Source: Ambulatory Visit | Attending: Obstetrics & Gynecology | Admitting: Obstetrics & Gynecology

## 2015-11-19 DIAGNOSIS — Z1231 Encounter for screening mammogram for malignant neoplasm of breast: Secondary | ICD-10-CM

## 2015-11-19 DIAGNOSIS — R928 Other abnormal and inconclusive findings on diagnostic imaging of breast: Secondary | ICD-10-CM | POA: Diagnosis not present

## 2015-11-20 ENCOUNTER — Other Ambulatory Visit: Payer: Self-pay | Admitting: Obstetrics & Gynecology

## 2015-11-20 DIAGNOSIS — R928 Other abnormal and inconclusive findings on diagnostic imaging of breast: Secondary | ICD-10-CM

## 2015-11-26 ENCOUNTER — Ambulatory Visit: Payer: BC Managed Care – PPO | Admitting: Obstetrics & Gynecology

## 2015-12-01 ENCOUNTER — Ambulatory Visit
Admission: RE | Admit: 2015-12-01 | Discharge: 2015-12-01 | Disposition: A | Discharge status: Home / Self Care | Payer: BC Managed Care – PPO | Source: Ambulatory Visit | Attending: Obstetrics & Gynecology | Admitting: Obstetrics & Gynecology

## 2015-12-01 ENCOUNTER — Other Ambulatory Visit: Payer: Self-pay | Admitting: Obstetrics & Gynecology

## 2015-12-01 DIAGNOSIS — R928 Other abnormal and inconclusive findings on diagnostic imaging of breast: Secondary | ICD-10-CM

## 2015-12-02 ENCOUNTER — Encounter: Payer: Self-pay | Admitting: Obstetrics & Gynecology

## 2015-12-02 ENCOUNTER — Ambulatory Visit (INDEPENDENT_AMBULATORY_CARE_PROVIDER_SITE_OTHER): Payer: BC Managed Care – PPO | Admitting: Obstetrics & Gynecology

## 2015-12-02 VITALS — BP 120/80 | HR 84 | Wt 288.0 lb

## 2015-12-02 DIAGNOSIS — N3010 Interstitial cystitis (chronic) without hematuria: Secondary | ICD-10-CM | POA: Diagnosis not present

## 2015-12-02 DIAGNOSIS — B373 Candidiasis of vulva and vagina: Secondary | ICD-10-CM

## 2015-12-02 DIAGNOSIS — Z01419 Encounter for gynecological examination (general) (routine) without abnormal findings: Secondary | ICD-10-CM

## 2015-12-02 DIAGNOSIS — B3731 Acute candidiasis of vulva and vagina: Secondary | ICD-10-CM

## 2015-12-02 MED ORDER — FLUCONAZOLE 150 MG PO TABS: 150.0000 mg | ORAL_TABLET | Freq: Once | ORAL | Status: DC

## 2015-12-02 NOTE — Addendum Note (Signed)
Addended by: Federico FlakeNES, Tambi Thole A on: 12/02/2015 03:03 PM   Modules accepted: Orders

## 2015-12-02 NOTE — Progress Notes (Signed)
Patient ID: Terri Tyler, female   DOB: 12/31/1970, 45 y.o.   MRN: [id number] Diagnosed with IC: several years  Current Meds:  Urelle, DMSO Dietary restrictions    Pt states her symptoms have been stable, no exacerbations, although not perfect Wants to continue on this cycle  Blood pressure 120/80, pulse 84, weight 288 lb (130.636 kg), last menstrual period 11/13/2015.    The external urethra meatus was prepped with betadine DMSO 50 cc was instilled in the usual fashion after the bladder was catheterized and emptied completely 50cc was instilled into the bladder without difficulty and the patient tolerated well She will refrain from voiding as long as possible  Follow up in 6 weeks, or as patient requests based on her symptom complex

## 2015-12-07 LAB — CYTOLOGY - PAP

## 2015-12-10 ENCOUNTER — Telehealth: Payer: Self-pay | Admitting: Obstetrics & Gynecology

## 2015-12-10 NOTE — Telephone Encounter (Signed)
Pt requesting pap results, pt informed per pap results from 12/02/2015, Unsatisfactory for evaluation." Repeat study recommended."  Pt also c/o of abdominal pain and would like to speak with Dr.Eure before he leaves the office today. Pt informed Dr.Eure has left the office will route the message.  Appt was scheduled for 12/15/2015 with Dr. Despina Hidden.

## 2015-12-11 NOTE — Telephone Encounter (Signed)
noted 

## 2015-12-15 ENCOUNTER — Other Ambulatory Visit (INDEPENDENT_AMBULATORY_CARE_PROVIDER_SITE_OTHER): Payer: BC Managed Care – PPO

## 2015-12-15 ENCOUNTER — Ambulatory Visit (INDEPENDENT_AMBULATORY_CARE_PROVIDER_SITE_OTHER): Payer: BC Managed Care – PPO | Admitting: Obstetrics & Gynecology

## 2015-12-15 ENCOUNTER — Encounter: Payer: Self-pay | Admitting: Obstetrics & Gynecology

## 2015-12-15 ENCOUNTER — Other Ambulatory Visit (HOSPITAL_COMMUNITY)
Admission: RE | Admit: 2015-12-15 | Discharge: 2015-12-15 | Disposition: A | Discharge status: Home / Self Care | Payer: BC Managed Care – PPO | Source: Ambulatory Visit | Attending: Obstetrics & Gynecology | Admitting: Obstetrics & Gynecology

## 2015-12-15 VITALS — BP 120/80 | HR 64 | Wt 284.0 lb

## 2015-12-15 DIAGNOSIS — Z1151 Encounter for screening for human papillomavirus (HPV): Secondary | ICD-10-CM | POA: Diagnosis not present

## 2015-12-15 DIAGNOSIS — Z01419 Encounter for gynecological examination (general) (routine) without abnormal findings: Secondary | ICD-10-CM | POA: Diagnosis present

## 2015-12-15 DIAGNOSIS — R102 Pelvic and perineal pain: Secondary | ICD-10-CM

## 2015-12-15 MED ORDER — TERCONAZOLE 0.4 % VA CREA: 1.0000 | TOPICAL_CREAM | Freq: Every day | VAGINAL | Status: DC

## 2015-12-15 MED ORDER — MEGESTROL ACETATE 40 MG PO TABS: ORAL_TABLET | ORAL | Status: DC

## 2015-12-15 NOTE — Progress Notes (Signed)
Patient ID: Terri Tyler, female   DOB: Apr 08, 1971, 45 y.o.   MRN: [id number]      Chief Complaint  Patient presents with  . gyn visit    abdominal pain    Blood pressure 120/80, pulse 64, weight 284 lb (128.822 kg), last menstrual period 12/08/2015.  44 y.o. G3P2003 Patient's last menstrual period was 12/08/2015. The current method of family planning is .  Subjective Pt with increased pelvic abdominal pain in ovary area, worried about ovarian cancer Some itching discharge  Objective Vulva:  normal appearing vulva with no masses, tenderness or lesions Vagina:  normal mucosa, curd-like discharge Cervix:  no cervical motion tenderness Uterus:  normal size, contour, position, consistency, mobility, non-tender Adnexa: ovaries:present,  normal adnexa in size, nontender and no masses    Pertinent ROS No burning with urination, frequency or urgency No nausea, vomiting or diarrhea Nor fever chills or other constitutional symptoms   Labs or studies GYNECOLOGIC SONOGRAM   Terri Tyler is a 45 y.o. G3P2003 LMP 12/08/2015 for a pelvic sonogram for bilat pelvic pain and bloating.  Uterus 10.5 x 5.6 x 5 cm, homogenous anteverted uterus  Endometrium 6.6 mm, symmetrical, wnl  Right ovary 3.2 x 2.2 x 2.8 cm, wnl  Left ovary 2.1 x 1.4 x 2.9 cm, wnl  No free fluid  Technician Comments:  US PELVIS TA/TV: homogenous anteverted uterus,normal ov's bilat (mobile),EEC 6.27mm,no free fluid,bilat adnexal pain during ultrasound     [employer] 12/15/2015 11:23 AM  Clinical Impression and recommendations:  I have reviewed the sonogram results above, combined with the patient's current clinical course, below are my impressions and any appropriate recommendations for management based on the sonographic findings.  Normal uterus, no pathology Normal endometrium in a menstruating woman, no pathology Both ovaries normal  size, no pathology No free fluid   EURE,LUTHER H 12/16/2015 8:09 PM    Impression Diagnoses this Encounter::   ICD-9-CM ICD-10-CM   1. Pelvic pain in female 625.9 R10.2 US Pelvis Complete     US Transvaginal Non-OB  2. Well woman exam V70.0 Z00.00 Cytology - PAP    Established relevant diagnosis(es): IC  Plan/Recommendations: Meds ordered this encounter  Medications  . terconazole (TERAZOL 7) 0.4 % vaginal cream    Sig: Place 1 applicator vaginally at bedtime.    Dispense:  45 g    Refill:  0  . DISCONTD: megestrol (MEGACE) 40 MG tablet    Sig: 3 tablets a day for 5 days, 2 tablets a day for 5 days then 1 tablet daily    Dispense:  45 tablet    Refill:  3    Labs or Scans Ordered: Orders Placed This Encounter  Procedures  . US Pelvis Complete  . US Transvaginal Non-OB    Management:: As above, normal sonogram  Follow up For her IC instillation or prn         All questions were answered.

## 2015-12-15 NOTE — Progress Notes (Signed)
US PELVIS TA/TV: homogenous anteverted uterus,normal ov's bilat (mobile),EEC 6.60mm,no free fluid,bilat adnexal pain during ultrasound

## 2015-12-17 LAB — CYTOLOGY - PAP

## 2016-01-14 ENCOUNTER — Ambulatory Visit: Payer: BC Managed Care – PPO | Admitting: Obstetrics & Gynecology

## 2016-01-21 ENCOUNTER — Telehealth: Payer: Self-pay | Admitting: *Deleted

## 2016-01-25 NOTE — Telephone Encounter (Signed)
Unable to reach pt x3 attempts 

## 2016-02-04 ENCOUNTER — Ambulatory Visit: Payer: BC Managed Care – PPO | Admitting: Obstetrics & Gynecology

## 2016-02-08 ENCOUNTER — Ambulatory Visit: Payer: BC Managed Care – PPO | Admitting: Obstetrics & Gynecology

## 2016-02-10 ENCOUNTER — Ambulatory Visit: Payer: BC Managed Care – PPO | Admitting: Obstetrics & Gynecology

## 2016-03-10 ENCOUNTER — Encounter: Payer: Self-pay | Admitting: Obstetrics & Gynecology

## 2016-03-10 ENCOUNTER — Ambulatory Visit (INDEPENDENT_AMBULATORY_CARE_PROVIDER_SITE_OTHER): Payer: BC Managed Care – PPO | Admitting: Obstetrics & Gynecology

## 2016-03-10 VITALS — BP 120/80 | HR 96 | Wt 291.0 lb

## 2016-03-10 DIAGNOSIS — N3010 Interstitial cystitis (chronic) without hematuria: Secondary | ICD-10-CM

## 2016-03-10 NOTE — Progress Notes (Signed)
Patient ID: Terri Tyler, female   DOB: 04/24/1971, 45 y.o.   MRN: [id number] Diagnosed with IC: couple of years  Current Meds:  DMSO, diet Dietary restrictions    Pt states her symptoms have been stable, no exacerbations, although not perfect Wants to continue on this cycle  Blood pressure 120/80, pulse 96, weight 291 lb (131.997 kg), last menstrual period 02/16/2016.    The external urethra meatus was prepped with betadine DMSO 50 cc was instilled in the usual fashion after the bladder was catheterized and emptied completely 50cc was instilled into the bladder without difficulty and the patient tolerated well She will refrain from voiding as long as possible  Follow up in 4 weeks, or as patient requests based on her symptom complex

## 2016-04-07 ENCOUNTER — Ambulatory Visit: Payer: BC Managed Care – PPO | Admitting: Obstetrics & Gynecology

## 2016-05-23 ENCOUNTER — Telehealth: Payer: Self-pay | Admitting: *Deleted

## 2016-05-23 NOTE — Telephone Encounter (Signed)
Pt made appt

## 2016-06-02 ENCOUNTER — Ambulatory Visit (INDEPENDENT_AMBULATORY_CARE_PROVIDER_SITE_OTHER): Payer: BC Managed Care – PPO | Admitting: Obstetrics & Gynecology

## 2016-06-02 ENCOUNTER — Encounter: Payer: Self-pay | Admitting: Obstetrics & Gynecology

## 2016-06-02 VITALS — BP 118/78 | HR 85 | Ht 62.0 in | Wt 290.0 lb

## 2016-06-02 DIAGNOSIS — N3010 Interstitial cystitis (chronic) without hematuria: Secondary | ICD-10-CM

## 2016-06-02 NOTE — Progress Notes (Signed)
Patient ID: Terri Tyler, female   DOB: 09/17/1971, 45 y.o.   MRN: [id number] Patient ID: Terri RazorYvette N Tyler, female   DOB: 04/01/1971, 45 y.o.   MRN: [id number] Diagnosed with IC: couple of years  Current Meds:  DMSO, diet Dietary restrictions    Pt states her symptoms have been stable, no exacerbations, although not perfect Wants to continue on this cycle  Blood pressure 118/78, pulse 85, height 5\' 2"  (1.575 m), weight 290 lb (131.543 kg), last menstrual period 05/18/2016.    The external urethra meatus was prepped with betadine DMSO 50 cc was instilled in the usual fashion after the bladder was catheterized and emptied completely 50cc was instilled into the bladder without difficulty and the patient tolerated well She will refrain from voiding as long as possible  Follow up in 4 weeks, or as patient requests based on her symptom complex

## 2016-07-08 ENCOUNTER — Ambulatory Visit: Payer: BC Managed Care – PPO | Admitting: Obstetrics & Gynecology

## 2016-08-04 ENCOUNTER — Ambulatory Visit: Payer: BC Managed Care – PPO | Admitting: Obstetrics & Gynecology

## 2017-03-07 ENCOUNTER — Encounter: Payer: Self-pay | Admitting: Obstetrics & Gynecology

## 2017-03-07 ENCOUNTER — Ambulatory Visit (INDEPENDENT_AMBULATORY_CARE_PROVIDER_SITE_OTHER): Payer: BC Managed Care – PPO | Admitting: Obstetrics & Gynecology

## 2017-03-07 VITALS — BP 132/76 | HR 76 | Ht 63.0 in | Wt 272.0 lb

## 2017-03-07 DIAGNOSIS — N3010 Interstitial cystitis (chronic) without hematuria: Secondary | ICD-10-CM | POA: Diagnosis not present

## 2017-04-20 NOTE — Progress Notes (Signed)
Patient ID: Terri Tyler, female   DOB: 01/30/1971, 46 y.o.   MRN: [id number] Patient ID: Terri Tyler, female   DOB: 08/30/1971, 46 y.o.   MRN: [id number] Diagnosed with IC: couple of years  Current Meds:  DMSO, diet Dietary restrictions    Pt states her symptoms have been stable, no exacerbations, although not perfect Wants to continue on this cycle  Blood pressure 132/76, pulse 76, height 5\' 3"  (1.6 m), weight 272 lb (123.4 kg), last menstrual period 02/25/2017.    The external urethra meatus was prepped with betadine DMSO 50 cc was instilled in the usual fashion after the bladder was catheterized and emptied completely 50cc was instilled into the bladder without difficulty and the patient tolerated well She will refrain from voiding as long as possible  Follow up in 4 weeks, or as patient requests based on her symptom complex No orders of the defined types were placed in this encounter.

## 2017-04-28 ENCOUNTER — Other Ambulatory Visit: Payer: Self-pay | Admitting: Obstetrics & Gynecology

## 2017-05-02 ENCOUNTER — Ambulatory Visit: Payer: BC Managed Care – PPO | Admitting: Obstetrics & Gynecology

## 2017-06-27 ENCOUNTER — Encounter: Payer: Self-pay | Admitting: Obstetrics & Gynecology

## 2017-06-27 ENCOUNTER — Ambulatory Visit (INDEPENDENT_AMBULATORY_CARE_PROVIDER_SITE_OTHER): Payer: BC Managed Care – PPO | Admitting: Obstetrics & Gynecology

## 2017-06-27 ENCOUNTER — Encounter (INDEPENDENT_AMBULATORY_CARE_PROVIDER_SITE_OTHER): Payer: Self-pay

## 2017-06-27 VITALS — BP 124/82 | HR 76 | Ht 65.0 in | Wt 280.5 lb

## 2017-06-27 DIAGNOSIS — N3941 Urge incontinence: Secondary | ICD-10-CM

## 2017-06-27 DIAGNOSIS — N3010 Interstitial cystitis (chronic) without hematuria: Secondary | ICD-10-CM | POA: Diagnosis not present

## 2017-06-27 MED ORDER — SOLIFENACIN SUCCINATE 10 MG PO TABS: 10.0000 mg | ORAL_TABLET | Freq: Every day | ORAL | 11 refills | Status: DC

## 2017-06-27 NOTE — Progress Notes (Signed)
Chief Complaint  Patient presents with  . Bladder Irrigation    has trouble holding urine at night    Blood pressure 124/82, pulse 76, height 5\' 5"  (1.651 m), weight 280 lb 8 oz (127.2 kg), last menstrual period 06/09/2017.  Terri Tyler is in today for her routine bladder irrigation Her last one was on 03/07/2017 She was diagnosed with I see several years ago  She is in today with an additional complaint of increasing amount of urine loss when she can't quite make it to the bathroom especially at night not really enuresis and not routine urge incontinence Really just occurs when she gets up to go the bathroom in the morning And she gets up only 1 time a night It can happen during the day but is not nearly as frequent  She does have episodes as well of stress incontinence but this is small volume related we talked about this regarding her need for weight loss   The external urethral meatus was prepped with Betadine 50 cc of DMSO was instilled in usual fashion after bladder was catheterized and completely emptied The patient Tolerated this well was no difficulty she will no she's posterior refrain from voiding as long as possible  We will see Terri Tyler back as she requests for ongoing bladder irrigation Whatever we set up she'll probably change due to her schedule  I had started her on vesicle care 10 mg at bedtime and see if this helps her urge symptoms  Meds ordered this encounter  Medications  . solifenacin (VESICARE) 10 MG tablet    Sig: Take 1 tablet (10 mg total) by mouth daily.    Dispense:  30 tablet    Refill:  11    Salem Lembke H, MD 06/27/2017 12:06 PM

## 2017-11-02 ENCOUNTER — Ambulatory Visit: Payer: BC Managed Care – PPO | Admitting: Obstetrics & Gynecology

## 2017-11-02 ENCOUNTER — Encounter: Payer: Self-pay | Admitting: Obstetrics & Gynecology

## 2017-11-02 VITALS — BP 150/88 | HR 97 | Ht 63.0 in | Wt 287.0 lb

## 2017-11-02 DIAGNOSIS — N3010 Interstitial cystitis (chronic) without hematuria: Secondary | ICD-10-CM

## 2017-11-02 NOTE — Progress Notes (Signed)
Diagnosed with IC: many years ago  Current Meds:  DMSO  urelle Dietary restrictions    Pt states her symptoms have been stable, no exacerbations, although not perfect Wants to continue on this cycle  Blood pressure (!) 150/88, pulse 97, height 5\' 3"  (1.6 m), weight 287 lb (130.2 kg), last menstrual period 10/21/2017.    The external urethra meatus was prepped with betadine DMSO 50 cc was instilled in the usual fashion after the bladder was catheterized and emptied completely 50cc was instilled into the bladder without difficulty and the patient tolerated well She will refrain from voiding as long as possible  Follow up in prn weeks, or as patient requests based on her symptom complex

## 2017-11-07 ENCOUNTER — Telehealth: Payer: Self-pay | Admitting: Obstetrics & Gynecology

## 2017-11-07 MED ORDER — SULFAMETHOXAZOLE-TRIMETHOPRIM 800-160 MG PO TABS: 1.0000 | ORAL_TABLET | Freq: Two times a day (BID) | ORAL | 0 refills | Status: DC

## 2017-11-07 NOTE — Telephone Encounter (Signed)
Patient called with complaints of pain and burning with urination. Thinks she has bladder infection. Had irrigation last Thursday. Please advise.

## 2017-11-07 NOTE — Telephone Encounter (Signed)
Pt notified prescription was sent to pharmacy

## 2017-11-07 NOTE — Telephone Encounter (Signed)
E prescribed bactrim ds 

## 2017-11-07 NOTE — Telephone Encounter (Signed)
Patient called stating that she normal sees Dr. Despina HiddenEure for her Bladder irrigation, pt states that she thinks that she might have a bladder infection. Pt can not come in for an appointment because she is in [place] right now. Please contatcpt

## 2017-11-20 ENCOUNTER — Ambulatory Visit: Payer: BC Managed Care – PPO | Admitting: Obstetrics & Gynecology

## 2017-12-18 ENCOUNTER — Ambulatory Visit: Payer: BC Managed Care – PPO | Admitting: Obstetrics & Gynecology

## 2018-05-15 ENCOUNTER — Encounter (INDEPENDENT_AMBULATORY_CARE_PROVIDER_SITE_OTHER): Payer: Self-pay

## 2018-05-15 ENCOUNTER — Encounter: Payer: Self-pay | Admitting: Obstetrics & Gynecology

## 2018-05-15 ENCOUNTER — Ambulatory Visit: Payer: BC Managed Care – PPO | Admitting: Obstetrics & Gynecology

## 2018-05-15 VITALS — BP 111/70 | HR 88 | Ht 63.0 in | Wt 287.0 lb

## 2018-05-15 DIAGNOSIS — N3010 Interstitial cystitis (chronic) without hematuria: Secondary | ICD-10-CM

## 2018-05-15 MED ORDER — ESCITALOPRAM OXALATE 20 MG PO TABS: 20.0000 mg | ORAL_TABLET | Freq: Every day | ORAL | 5 refills | Status: DC

## 2018-05-15 NOTE — Progress Notes (Signed)
Diagnosed with IC: several years ago  Current Meds:  DMSO Dietary restrictions    Pt states her symptoms have been stable, no exacerbations, although not perfect Wants to continue on this cycle  Blood pressure 111/70, pulse 88, height 5\' 3"  (1.6 m), weight 287 lb (130.2 kg), last menstrual period 05/04/2018.    The external urethra meatus was prepped with betadine DMSO 50 cc was instilled in the usual fashion after the bladder was catheterized and emptied completely 50cc was instilled into the bladder without difficulty and the patient tolerated well She will refrain from voiding as long as possible  Follow up in 8 weeks, or as patient requests based on her symptom complex

## 2018-06-05 ENCOUNTER — Ambulatory Visit: Payer: BC Managed Care – PPO | Admitting: Obstetrics & Gynecology

## 2018-06-07 ENCOUNTER — Encounter: Payer: Self-pay | Admitting: Obstetrics & Gynecology

## 2018-06-07 ENCOUNTER — Ambulatory Visit: Payer: BC Managed Care – PPO | Admitting: Obstetrics & Gynecology

## 2018-06-07 VITALS — BP 126/81 | HR 93 | Ht 63.0 in | Wt 287.0 lb

## 2018-06-07 DIAGNOSIS — N760 Acute vaginitis: Secondary | ICD-10-CM

## 2018-06-07 DIAGNOSIS — B9689 Other specified bacterial agents as the cause of diseases classified elsewhere: Secondary | ICD-10-CM

## 2018-06-07 MED ORDER — METRONIDAZOLE 0.75 % VA GEL: VAGINAL | 0 refills | Status: DC

## 2018-06-07 MED ORDER — FLUCONAZOLE 150 MG PO TABS: 150.0000 mg | ORAL_TABLET | Freq: Once | ORAL | 0 refills | Status: AC

## 2018-06-07 NOTE — Progress Notes (Signed)
Chief Complaint  Patient presents with  . work-in-gyn    burning/ cramping in stomach      47 y.o. G2P2003 Patient's last menstrual period was 05/22/2018. The current method of family planning is .  Outpatient Encounter Medications as of 06/07/2018  Medication Sig  . escitalopram (LEXAPRO) 20 MG tablet Take 1 tablet (20 mg total) by mouth daily.  Marland Kitchen URELLE (URELLE/URISED) 81 MG TABS tablet Take 1 tablet (81 mg total) by mouth every 6 (six) hours as needed for bladder spasms.  . dimethyl sulfoxide 50 % solution To be instilled in bladder by Dr Despina Hidden (Patient not taking: Reported on 05/15/2018)  . fluconazole (DIFLUCAN) 150 MG tablet Take 1 tablet (150 mg total) by mouth once for 1 dose. Take the second tablet 3 days after the first one.  . metroNIDAZOLE (METROGEL VAGINAL) 0.75 % vaginal gel Nightly x 5 nights  . sulfamethoxazole-trimethoprim (BACTRIM DS,SEPTRA DS) 800-160 MG tablet Take 1 tablet by mouth 2 (two) times daily. (Patient not taking: Reported on 05/15/2018)   No facility-administered encounter medications on file as of 06/07/2018.     Subjective Terri Tyler with burning irritation for 5 days No discharge noted No urinary issues No bleeding associated Past Medical History:  Diagnosis Date  . Bronchitis   . BV (bacterial vaginosis)   . History of UTI   . Interstitial cystitis     Past Surgical History:  Procedure Laterality Date  . NO PAST SURGERIES      OB History    Gravida  2   Para  2   Term  2   Preterm      AB      Living  3     SAB      TAB      Ectopic      Multiple  1   Live Births  3           Allergies  Allergen Reactions  . Other     novacaine-dizziness     Social History   Socioeconomic History  . Marital status: Married    Spouse name: Not on file  . Number of children: Not on file  . Years of education: Not on file  . Highest education level: Not on file  Occupational History  . Not on file  Social  Needs  . Financial resource strain: Not on file  . Food insecurity:    Worry: Not on file    Inability: Not on file  . Transportation needs:    Medical: Not on file    Non-medical: Not on file  Tobacco Use  . Smoking status: Never Smoker  . Smokeless tobacco: Never Used  Substance and Sexual Activity  . Alcohol use: No    Alcohol/week: 0.0 oz  . Drug use: No  . Sexual activity: Yes    Birth control/protection: Surgical    Comment: vasectomy  Lifestyle  . Physical activity:    Days per week: Not on file    Minutes per session: Not on file  . Stress: Not on file  Relationships  . Social connections:    Talks on phone: Not on file    Gets together: Not on file    Attends religious service: Not on file    Active member of club or organization: Not on file    Attends meetings of clubs or organizations: Not on file    Relationship status: Not on file  Other  Topics Concern  . Not on file  Social History Narrative  . Not on file    Family History  Problem Relation Age of Onset  . Colon cancer Paternal Grandfather   . Diabetes Paternal Grandmother   . Colon cancer Maternal Grandmother   . Hypertension Father     Medications:       Current Outpatient Medications:  .  escitalopram (LEXAPRO) 20 MG tablet, Take 1 tablet (20 mg total) by mouth daily., Disp: 30 tablet, Rfl: 5 .  URELLE (URELLE/URISED) 81 MG TABS tablet, Take 1 tablet (81 mg total) by mouth every 6 (six) hours as needed for bladder spasms., Disp: 120 each, Rfl: 11 .  dimethyl sulfoxide 50 % solution, To be instilled in bladder by Dr Despina HiddenEure (Patient not taking: Reported on 05/15/2018), Disp: 50 mL, Rfl: 11 .  fluconazole (DIFLUCAN) 150 MG tablet, Take 1 tablet (150 mg total) by mouth once for 1 dose. Take the second tablet 3 days after the first one., Disp: 2 tablet, Rfl: 0 .  metroNIDAZOLE (METROGEL VAGINAL) 0.75 % vaginal gel, Nightly x 5 nights, Disp: 70 g, Rfl: 0 .  sulfamethoxazole-trimethoprim (BACTRIM DS,SEPTRA  DS) 800-160 MG tablet, Take 1 tablet by mouth 2 (two) times daily. (Patient not taking: Reported on 05/15/2018), Disp: 14 tablet, Rfl: 0  Objective Blood pressure 126/81, pulse 93, height 5\' 3"  (1.6 m), weight 287 lb (130.2 kg), last menstrual period 05/22/2018.  General WDWN female NAD Vulva:  normal appearing vulva with no masses, tenderness or lesions Vagina:  normal mucosa, thin grey discharge Cervix:  no cervical motion tenderness and no lesions Uterus:  normal size, contour, position, consistency, mobility, non-tender Adnexa: ovaries:present,  normal adnexa in size, nontender and no masses   Pertinent ROS No burning with urination, frequency or urgency No nausea, vomiting or diarrhea Nor fever chills or other constitutional symptoms   Labs or studies See wet prep results below    Impression Diagnoses this Encounter::   ICD-10-CM   1. BV (bacterial vaginosis) N76.0    B96.89     Established relevant diagnosis(es):   Plan/Recommendations: Meds ordered this encounter  Medications  . metroNIDAZOLE (METROGEL VAGINAL) 0.75 % vaginal gel    Sig: Nightly x 5 nights    Dispense:  70 g    Refill:  0  . fluconazole (DIFLUCAN) 150 MG tablet    Sig: Take 1 tablet (150 mg total) by mouth once for 1 dose. Take the second tablet 3 days after the first one.    Dispense:  2 tablet    Refill:  0    Labs or Scans Ordered: Wet Prep:   A sample of vaginal discharge was obtained from the posterior fornix using a cotton swab. 2 drops of saline were placed on a slide and the cotton swab was immersed in the saline. Microscopic evaluation was performed and results were as follows:  Negative  for yeast  Positive for clue cells , consistent with Bacterial vaginosis Negative for trichomonas  Normal WBC population   Whiff test: Positive   Management:: metrogel for 5 days Script for diflucan as pt is susceptible for after her metrogel  Follow up Return if symptoms worsen or fail  to improve.      All questions were answered.

## 2018-06-22 ENCOUNTER — Ambulatory Visit (INDEPENDENT_AMBULATORY_CARE_PROVIDER_SITE_OTHER): Payer: BC Managed Care – PPO | Admitting: Obstetrics & Gynecology

## 2018-06-22 ENCOUNTER — Other Ambulatory Visit: Payer: Self-pay

## 2018-06-22 ENCOUNTER — Encounter: Payer: Self-pay | Admitting: Obstetrics & Gynecology

## 2018-06-22 VITALS — BP 135/78 | HR 84 | Ht 64.0 in | Wt 288.0 lb

## 2018-06-22 DIAGNOSIS — N3010 Interstitial cystitis (chronic) without hematuria: Secondary | ICD-10-CM | POA: Diagnosis not present

## 2018-06-22 DIAGNOSIS — R5383 Other fatigue: Secondary | ICD-10-CM

## 2018-06-22 NOTE — Progress Notes (Signed)
Diagnosed with IC: several years ago  Current Meds:  DMSO, urelle Dietary restrictions    Pt states her symptoms have been stable, no exacerbations, although not perfect Wants to continue on this cycle  Blood pressure 135/78, pulse 84, height 5\' 4"  (1.626 m), weight 288 lb (130.6 kg), last menstrual period 05/28/2018.    The external urethra meatus was prepped with betadine DMSO 50 cc was instilled in the usual fashion after the bladder was catheterized and emptied completely 50cc was instilled into the bladder without difficulty and the patient tolerated well She will refrain from voiding as long as possible  Follow up in 8 weeks, or as patient requests based on her symptom complex   Check TSH due to lethargy

## 2018-06-23 LAB — TSH: TSH: 1.42 u[IU]/mL (ref 0.450–4.500)

## 2018-07-09 ENCOUNTER — Ambulatory Visit (HOSPITAL_COMMUNITY)
Admission: RE | Admit: 2018-07-09 | Discharge: 2018-07-09 | Disposition: A | Discharge status: Home / Self Care | Payer: BC Managed Care – PPO | Source: Ambulatory Visit | Attending: Obstetrics & Gynecology | Admitting: Obstetrics & Gynecology

## 2018-07-09 ENCOUNTER — Other Ambulatory Visit: Payer: Self-pay | Admitting: Obstetrics & Gynecology

## 2018-07-09 ENCOUNTER — Telehealth: Payer: Self-pay | Admitting: *Deleted

## 2018-07-09 ENCOUNTER — Ambulatory Visit (HOSPITAL_COMMUNITY): Payer: BC Managed Care – PPO

## 2018-07-09 DIAGNOSIS — Z1231 Encounter for screening mammogram for malignant neoplasm of breast: Secondary | ICD-10-CM

## 2018-07-10 ENCOUNTER — Telehealth: Payer: Self-pay | Admitting: *Deleted

## 2018-07-10 ENCOUNTER — Ambulatory Visit: Payer: BC Managed Care – PPO | Admitting: Obstetrics & Gynecology

## 2018-07-10 NOTE — Telephone Encounter (Signed)
Pt called requesting lab results. DOB verified. Informed pt that TSH was normal. Pt verbalized understanding.

## 2018-07-10 NOTE — Telephone Encounter (Signed)
Mailbox full . Unable to leave message

## 2018-08-16 ENCOUNTER — Ambulatory Visit: Payer: BC Managed Care – PPO | Admitting: Obstetrics & Gynecology

## 2018-10-01 ENCOUNTER — Ambulatory Visit (HOSPITAL_COMMUNITY)
Admission: RE | Admit: 2018-10-01 | Discharge: 2018-10-01 | Disposition: A | Discharge status: Home / Self Care | Payer: BC Managed Care – PPO | Source: Ambulatory Visit | Attending: Internal Medicine | Admitting: Internal Medicine

## 2018-10-01 ENCOUNTER — Other Ambulatory Visit (HOSPITAL_COMMUNITY): Payer: Self-pay | Admitting: Internal Medicine

## 2018-10-01 DIAGNOSIS — R079 Chest pain, unspecified: Secondary | ICD-10-CM

## 2018-10-10 DIAGNOSIS — F99 Mental disorder, not otherwise specified: Secondary | ICD-10-CM | POA: Insufficient documentation

## 2018-10-10 DIAGNOSIS — F5105 Insomnia due to other mental disorder: Secondary | ICD-10-CM | POA: Insufficient documentation

## 2018-10-10 DIAGNOSIS — F410 Panic disorder [episodic paroxysmal anxiety] without agoraphobia: Secondary | ICD-10-CM | POA: Insufficient documentation

## 2018-10-11 ENCOUNTER — Telehealth: Payer: Self-pay | Admitting: Obstetrics & Gynecology

## 2018-10-11 NOTE — Telephone Encounter (Signed)
Mail box full @ 3:53 pm. JSY

## 2018-10-11 NOTE — Telephone Encounter (Signed)
Patient called, requesting anxiety medication.  [place]  [phone]

## 2018-10-11 NOTE — Telephone Encounter (Signed)
Spoke with pt. Pt is requesting anxiety med. Has been on Klonopin in the past. Please advise. Thanks!! JSY

## 2018-10-12 ENCOUNTER — Other Ambulatory Visit: Payer: Self-pay | Admitting: Obstetrics & Gynecology

## 2018-10-12 MED ORDER — CLONAZEPAM 0.5 MG PO TABS: 0.5000 mg | ORAL_TABLET | Freq: Two times a day (BID) | ORAL | 0 refills | Status: DC | PRN

## 2018-10-12 NOTE — Telephone Encounter (Signed)
done

## 2018-11-19 ENCOUNTER — Telehealth (HOSPITAL_COMMUNITY): Payer: Self-pay | Admitting: Professional

## 2018-11-20 ENCOUNTER — Telehealth (HOSPITAL_COMMUNITY): Payer: Self-pay | Admitting: Licensed Clinical Social Worker

## 2018-11-22 ENCOUNTER — Telehealth (HOSPITAL_COMMUNITY): Payer: Self-pay | Admitting: Professional

## 2018-12-03 ENCOUNTER — Telehealth (HOSPITAL_COMMUNITY): Payer: Self-pay | Admitting: Licensed Clinical Social Worker

## 2018-12-05 ENCOUNTER — Ambulatory Visit (HOSPITAL_COMMUNITY): Payer: BC Managed Care – PPO

## 2018-12-05 ENCOUNTER — Telehealth (HOSPITAL_COMMUNITY): Payer: Self-pay | Admitting: Psychiatry

## 2018-12-05 NOTE — Telephone Encounter (Signed)
D:  Pt phoned inquiring about MH-IOP; had previously called wanting PHP phone number.  According to pt, she has been struggling with increased anxiety with intrusive thoughts of self-injurious behavior (ie. Cutting).  "I am looking for someone to see me everyday to do individual therapy."  A:  Provided pt with support.  Informed pt that writer did not know of any therapists who would have that availability.  Discussed CBT vs. DBT.  Also, discussed safety options at length (ie. inpt hospitalization).  Pt states she feels that she may need to go into the hospital.  Currently at work.  Denies SI at this time.  "I'm just very anxious."  Informed pt to go to nearest ED or go to [facility] for an assessment if sx's start to worsen.  Provided pt with [facility], [facility], and [facility] phone numbers for an individual therapist.  Also, mentioned MH-IOP had opening tomorrow.  Informed Hillery Jacks, NP.  R:  Pt receptive.  States she will call the therapists first.

## 2019-01-17 ENCOUNTER — Ambulatory Visit: Payer: BC Managed Care – PPO | Admitting: Psychology

## 2019-01-23 NOTE — Telephone Encounter (Signed)
Note sent to nurse. 

## 2019-01-30 ENCOUNTER — Ambulatory Visit: Payer: Self-pay | Admitting: Psychology

## 2019-02-13 ENCOUNTER — Ambulatory Visit: Payer: Self-pay | Admitting: Psychology

## 2019-02-25 ENCOUNTER — Telehealth: Payer: Self-pay | Admitting: *Deleted

## 2019-02-25 NOTE — Telephone Encounter (Signed)
Pt would like to reschedule appt. New appt given

## 2019-02-26 ENCOUNTER — Ambulatory Visit: Payer: Self-pay | Admitting: Obstetrics & Gynecology

## 2019-03-26 ENCOUNTER — Other Ambulatory Visit: Payer: Self-pay

## 2019-03-26 ENCOUNTER — Ambulatory Visit: Payer: BC Managed Care – PPO | Admitting: Obstetrics & Gynecology

## 2019-03-26 ENCOUNTER — Encounter: Payer: Self-pay | Admitting: Obstetrics & Gynecology

## 2019-03-26 VITALS — BP 149/105 | HR 100 | Temp 98.6°F | Ht 62.0 in | Wt 241.0 lb

## 2019-03-26 DIAGNOSIS — R350 Frequency of micturition: Secondary | ICD-10-CM

## 2019-03-26 DIAGNOSIS — M5441 Lumbago with sciatica, right side: Secondary | ICD-10-CM

## 2019-03-26 DIAGNOSIS — R319 Hematuria, unspecified: Secondary | ICD-10-CM | POA: Diagnosis not present

## 2019-03-26 DIAGNOSIS — M5442 Lumbago with sciatica, left side: Secondary | ICD-10-CM

## 2019-03-26 DIAGNOSIS — N3010 Interstitial cystitis (chronic) without hematuria: Secondary | ICD-10-CM | POA: Diagnosis not present

## 2019-03-26 DIAGNOSIS — R300 Dysuria: Secondary | ICD-10-CM

## 2019-03-26 LAB — POCT URINALYSIS DIPSTICK
Glucose, UA: NEGATIVE
Ketones, UA: NEGATIVE
Leukocytes, UA: NEGATIVE
Nitrite, UA: NEGATIVE
Protein, UA: NEGATIVE

## 2019-03-26 MED ORDER — CYCLOBENZAPRINE HCL 10 MG PO TABS: 10.0000 mg | ORAL_TABLET | Freq: Three times a day (TID) | ORAL | 1 refills | Status: DC | PRN

## 2019-03-26 MED ORDER — METRONIDAZOLE 0.75 % VA GEL: VAGINAL | 0 refills | Status: DC

## 2019-03-26 NOTE — Progress Notes (Signed)
Chief Complaint  Patient presents with  . Back Pain    pain goes into legs; headache; urinary freuency; burning with urination; pain in low abd; ? kidney stone       48 y.o. [id number] Patient's last menstrual period was 03/12/2019 (approximate). The current method of family planning is vasectomy.  Outpatient Encounter Medications as of 03/26/2019  Medication Sig  . ciprofloxacin (CIPRO) 500 MG tablet Take 500 mg by mouth 2 (two) times daily.   . dimethyl sulfoxide 50 % solution To be instilled in bladder by Dr Despina Hidden  . phenazopyridine (PYRIDIUM) 200 MG tablet 2 (two) times a day.   . sertraline (ZOLOFT) 100 MG tablet 200 mg daily.   Marland Kitchen URELLE (URELLE/URISED) 81 MG TABS tablet Take 1 tablet (81 mg total) by mouth every 6 (six) hours as needed for bladder spasms.  . cyclobenzaprine (FLEXERIL) 10 MG tablet Take 1 tablet (10 mg total) by mouth every 8 (eight) hours as needed for muscle spasms.  . metroNIDAZOLE (METROGEL VAGINAL) 0.75 % vaginal gel Nightly x 5 nights  . [DISCONTINUED] clonazePAM (KLONOPIN) 0.5 MG tablet Take 1 tablet (0.5 mg total) by mouth 2 (two) times daily as needed for anxiety.  . [DISCONTINUED] escitalopram (LEXAPRO) 20 MG tablet Take 1 tablet (20 mg total) by mouth daily.   No facility-administered encounter medications on file as of 03/26/2019.     Subjective Pt with 2 days of low back pain radiating down legs both sides And to front of legs Also with some vaginal discharge irritation for a week No IC therapy since 06/2018 Called Dr Alonza Smoker office and he started her empirically on ciprofloxacin Past Medical History:  Diagnosis Date  . Bronchitis   . BV (bacterial vaginosis)   . History of UTI   . Interstitial cystitis     Past Surgical History:  Procedure Laterality Date  . NO PAST SURGERIES      OB History    Gravida  2   Para  2   Term  2   Preterm      AB      Living  3     SAB      TAB      Ectopic      Multiple  1   Live  Births  3           Allergies  Allergen Reactions  . Other     novacaine-dizziness     Social History   Socioeconomic History  . Marital status: Married    Spouse name: Not on file  . Number of children: Not on file  . Years of education: Not on file  . Highest education level: Not on file  Occupational History  . Not on file  Social Needs  . Financial resource strain: Not on file  . Food insecurity:    Worry: Not on file    Inability: Not on file  . Transportation needs:    Medical: Not on file    Non-medical: Not on file  Tobacco Use  . Smoking status: Never Smoker  . Smokeless tobacco: Never Used  Substance and Sexual Activity  . Alcohol use: No    Alcohol/week: 0.0 standard drinks  . Drug use: No  . Sexual activity: Yes    Birth control/protection: Surgical    Comment: vasectomy  Lifestyle  . Physical activity:    Days per week: Not on file    Minutes per session: Not on  file  . Stress: Not on file  Relationships  . Social connections:    Talks on phone: Not on file    Gets together: Not on file    Attends religious service: Not on file    Active member of club or organization: Not on file    Attends meetings of clubs or organizations: Not on file    Relationship status: Not on file  Other Topics Concern  . Not on file  Social History Narrative  . Not on file    Family History  Problem Relation Age of Onset  . Colon cancer Paternal Grandfather   . Diabetes Paternal Grandmother   . Colon cancer Maternal Grandmother   . Hypertension Father     Medications:       Current Outpatient Medications:  .  ciprofloxacin (CIPRO) 500 MG tablet, Take 500 mg by mouth 2 (two) times daily. , Disp: , Rfl:  .  dimethyl sulfoxide 50 % solution, To be instilled in bladder by Dr Despina Hidden, Disp: 50 mL, Rfl: 11 .  phenazopyridine (PYRIDIUM) 200 MG tablet, 2 (two) times a day. , Disp: , Rfl:  .  sertraline (ZOLOFT) 100 MG tablet, 200 mg daily. , Disp: , Rfl:  .   URELLE (URELLE/URISED) 81 MG TABS tablet, Take 1 tablet (81 mg total) by mouth every 6 (six) hours as needed for bladder spasms., Disp: 120 each, Rfl: 11 .  cyclobenzaprine (FLEXERIL) 10 MG tablet, Take 1 tablet (10 mg total) by mouth every 8 (eight) hours as needed for muscle spasms., Disp: 30 tablet, Rfl: 1 .  metroNIDAZOLE (METROGEL VAGINAL) 0.75 % vaginal gel, Nightly x 5 nights, Disp: 70 g, Rfl: 0  Objective Blood pressure (!) 149/105, pulse 100, temperature 98.6 F (37 C), height  (1.575 m), weight 241 lb (109.3 kg), last menstrual period 03/12/2019.  General WDWN female NAD Back with tenderness around both PSIS Vulva:  normal appearing vulva with no masses, tenderness or lesions Vagina:  normal mucosa, thin grey discharge Cervix:  no cervical motion tenderness and no lesions Uterus:   Adnexa: ovaries:,    DMSO irrigation performed today     The external urethra meatus was prepped with betadine DMSO 50 cc was instilled in the usual fashion after the bladder was catheterized and emptied completely 50cc was instilled into the bladder without difficulty and the patient tolerated well She will refrain from voiding as long as possible  Follow up in 8 weeks, or as patient requests based on her symptom complex  Pertinent ROS    Labs or studies Urine culture pending    Impression Diagnoses this Encounter::   ICD-10-CM   1. Chronic interstitial cystitis N30.10   2. Hematuria, unspecified type R31.9 POCT Urinalysis Dipstick  3. Urinary frequency R35.0 POCT Urinalysis Dipstick  4. Burning with urination R30.0 POCT Urinalysis Dipstick  5. Acute bilateral low back pain with bilateral sciatica M54.42    M54.41     Established relevant diagnosis(es):   Plan/Recommendations: Meds ordered this encounter  Medications  . metroNIDAZOLE (METROGEL VAGINAL) 0.75 % vaginal gel    Sig: Nightly x 5 nights    Dispense:  70 g    Refill:  0  . cyclobenzaprine (FLEXERIL) 10 MG  tablet    Sig: Take 1 tablet (10 mg total) by mouth every 8 (eight) hours as needed for muscle spasms.    Dispense:  30 tablet    Refill:  1    Labs or Scans Ordered: Orders  Placed This Encounter  Procedures  . POCT Urinalysis Dipstick    Management:: >IC received bladder irrigation today first one since August of 2019 >primary muscle issue as cause of low back pain, history and exam exclude kidney stones or infection, instructed to stop antibiotics and await culture report >NSV on wet prep, not yeast or BV or trichomonas, treat with metrogel initially doxycycline orally if no repsonse  Follow up Return in about 8 weeks (around 05/21/2019), or if symptoms worsen or fail to improve, for bladder irrigation.       All questions were answered.

## 2019-03-26 NOTE — Addendum Note (Signed)
Addended by: Colen Darling on: 03/26/2019 12:17 PM   Modules accepted: Orders

## 2019-03-28 LAB — URINE CULTURE

## 2019-04-08 ENCOUNTER — Ambulatory Visit: Payer: Self-pay | Admitting: Obstetrics & Gynecology

## 2019-05-20 ENCOUNTER — Telehealth: Payer: Self-pay | Admitting: *Deleted

## 2019-05-20 NOTE — Telephone Encounter (Signed)
Attempted to call patient with covid restrictions and appointment info but no answer or vm.

## 2019-05-21 ENCOUNTER — Ambulatory Visit: Payer: BC Managed Care – PPO | Admitting: Obstetrics & Gynecology

## 2019-05-30 ENCOUNTER — Telehealth: Payer: Self-pay | Admitting: *Deleted

## 2019-05-30 NOTE — Telephone Encounter (Signed)
I called patient no answer voicemail full

## 2019-05-31 ENCOUNTER — Other Ambulatory Visit: Payer: Self-pay

## 2019-05-31 ENCOUNTER — Ambulatory Visit (INDEPENDENT_AMBULATORY_CARE_PROVIDER_SITE_OTHER): Payer: BC Managed Care – PPO | Admitting: Obstetrics & Gynecology

## 2019-05-31 ENCOUNTER — Encounter: Payer: Self-pay | Admitting: Obstetrics & Gynecology

## 2019-05-31 VITALS — BP 126/77 | HR 95 | Ht 63.0 in | Wt 255.3 lb

## 2019-05-31 DIAGNOSIS — N3010 Interstitial cystitis (chronic) without hematuria: Secondary | ICD-10-CM | POA: Diagnosis not present

## 2019-05-31 NOTE — Progress Notes (Signed)
Diagnosed with IC: years ago  Current Meds:  DMSO Dietary restrictions    Pt states her symptoms have been stable, no exacerbations, although not perfect Wants to continue on this cycle  Blood pressure 126/77, pulse 95, height 5\' 3"  (1.6 m), weight 255 lb 4.8 oz (115.8 kg), last menstrual period 05/15/2019.    The external urethra meatus was prepped with betadine DMSO 50 cc was instilled in the usual fashion after the bladder was catheterized and emptied completely 50cc was instilled into the bladder without difficulty and the patient tolerated well She will refrain from voiding as long as possible  Follow up in 8 weeks, or as patient requests based on her symptom complex

## 2019-07-26 ENCOUNTER — Ambulatory Visit: Payer: BC Managed Care – PPO | Admitting: Obstetrics & Gynecology

## 2019-07-31 ENCOUNTER — Telehealth: Payer: Self-pay | Admitting: Obstetrics & Gynecology

## 2019-07-31 NOTE — Telephone Encounter (Signed)
Tried to reach the patient to remind her of her appointment/restrictions, both mailboxes are full.

## 2019-08-01 ENCOUNTER — Ambulatory Visit: Payer: BC Managed Care – PPO | Admitting: Obstetrics & Gynecology

## 2019-08-12 ENCOUNTER — Telehealth: Payer: Self-pay | Admitting: Obstetrics & Gynecology

## 2019-08-12 NOTE — Telephone Encounter (Signed)
Tried to reach the patient to remind her of her appointment/restrictions, mailbox is full. 

## 2019-08-13 ENCOUNTER — Ambulatory Visit: Payer: BC Managed Care – PPO | Admitting: Obstetrics & Gynecology

## 2019-10-15 ENCOUNTER — Other Ambulatory Visit: Payer: Self-pay | Admitting: *Deleted

## 2019-10-15 MED ORDER — URELLE 81 MG PO TABS: 1.0000 | ORAL_TABLET | Freq: Four times a day (QID) | ORAL | 0 refills | Status: DC | PRN

## 2019-10-15 NOTE — Telephone Encounter (Signed)
Pt left message requesting that Dr. Elonda Husky refill her Theda [place].

## 2019-10-21 ENCOUNTER — Telehealth: Payer: Self-pay | Admitting: Obstetrics & Gynecology

## 2019-10-21 NOTE — Telephone Encounter (Signed)

## 2019-10-22 ENCOUNTER — Ambulatory Visit: Payer: BC Managed Care – PPO | Admitting: Obstetrics & Gynecology

## 2019-10-29 ENCOUNTER — Ambulatory Visit: Payer: BC Managed Care – PPO | Admitting: Obstetrics & Gynecology

## 2020-02-10 ENCOUNTER — Telehealth: Payer: Self-pay | Admitting: Obstetrics & Gynecology

## 2020-02-10 NOTE — Telephone Encounter (Signed)

## 2020-02-11 ENCOUNTER — Ambulatory Visit: Payer: BC Managed Care – PPO | Admitting: Obstetrics & Gynecology

## 2020-02-13 ENCOUNTER — Telehealth: Payer: Self-pay | Admitting: Obstetrics & Gynecology

## 2020-02-13 NOTE — Telephone Encounter (Signed)

## 2020-02-17 ENCOUNTER — Ambulatory Visit: Payer: Self-pay | Admitting: Obstetrics & Gynecology

## 2020-03-02 ENCOUNTER — Telehealth: Payer: Self-pay | Admitting: Obstetrics & Gynecology

## 2020-03-02 NOTE — Telephone Encounter (Signed)
Tried to contact the patient to remind her of appointment/restrictions, mb is full.

## 2020-03-03 ENCOUNTER — Ambulatory Visit: Payer: Self-pay | Admitting: Obstetrics & Gynecology

## 2020-08-03 ENCOUNTER — Telehealth: Payer: Self-pay | Admitting: *Deleted

## 2020-08-03 ENCOUNTER — Telehealth: Payer: Self-pay | Admitting: Obstetrics & Gynecology

## 2020-08-03 ENCOUNTER — Other Ambulatory Visit: Payer: Self-pay

## 2020-08-03 DIAGNOSIS — R300 Dysuria: Secondary | ICD-10-CM

## 2020-08-03 NOTE — Telephone Encounter (Signed)
Pt would like a call thinks she has a kidney infection can she come by and just give sample or does she need to be seen/ she get bladder irrgations with Despina Hidden but has not had one in awhile made her appt for Friday

## 2020-08-03 NOTE — Telephone Encounter (Signed)
Pt advised she can come in today and leave a urine sample. Still keep appt for Friday. JSY

## 2020-08-03 NOTE — Telephone Encounter (Signed)
Called patient back, no answer. VM is full.

## 2020-08-04 ENCOUNTER — Telehealth: Payer: Self-pay | Admitting: Obstetrics & Gynecology

## 2020-08-04 NOTE — Telephone Encounter (Signed)
Culture not back, patient having pain and dysuria. Feels like she is getting worse.

## 2020-08-04 NOTE — Telephone Encounter (Signed)
Patient would like results from urine culture. States that her symptoms have gotten worse and would like something sent to her pharmacy.

## 2020-08-05 ENCOUNTER — Telehealth: Payer: Self-pay | Admitting: Obstetrics & Gynecology

## 2020-08-05 MED ORDER — CIPROFLOXACIN HCL 500 MG PO TABS: 500.0000 mg | ORAL_TABLET | Freq: Two times a day (BID) | ORAL | 0 refills | Status: DC

## 2020-08-05 NOTE — Telephone Encounter (Signed)
Cipro e prescribed

## 2020-08-05 NOTE — Telephone Encounter (Signed)
Called patient back to inform her that antibiotics had been sent in. No answer and vm is full.

## 2020-08-07 ENCOUNTER — Encounter: Payer: Self-pay | Admitting: Obstetrics & Gynecology

## 2020-08-07 ENCOUNTER — Ambulatory Visit: Payer: BC Managed Care – PPO | Admitting: Obstetrics & Gynecology

## 2020-08-07 VITALS — BP 119/84 | Ht 65.0 in | Wt 288.8 lb

## 2020-08-07 DIAGNOSIS — N3010 Interstitial cystitis (chronic) without hematuria: Secondary | ICD-10-CM | POA: Diagnosis not present

## 2020-08-07 DIAGNOSIS — N390 Urinary tract infection, site not specified: Secondary | ICD-10-CM

## 2020-08-07 MED ORDER — URELLE 81 MG PO TABS: 1.0000 | ORAL_TABLET | Freq: Four times a day (QID) | ORAL | 3 refills | Status: AC | PRN

## 2020-08-07 NOTE — Progress Notes (Signed)
Diagnosed with IC:   Current Meds:  DMSO Dietary restrictions    Pt states her symptoms have been stable, no exacerbations, although not perfect Wants to continue on this cycle  Blood pressure 119/84, height 5\' 5"  (1.651 m), weight 288 lb 12.8 oz (131 kg), last menstrual period 07/17/2020.    The external urethra meatus was prepped with betadine DMSO 50 cc was instilled in the usual fashion after the bladder was catheterized and emptied completely 50cc was instilled into the bladder without difficulty and the patient tolerated well She will refrain from voiding as long as possible  Follow up in 8 weeks, or as patient requests based on her symptom complex

## 2020-08-08 LAB — URINE CULTURE

## 2020-10-05 ENCOUNTER — Ambulatory Visit: Payer: BC Managed Care – PPO | Admitting: Obstetrics & Gynecology

## 2020-11-24 ENCOUNTER — Other Ambulatory Visit (HOSPITAL_COMMUNITY): Payer: Self-pay | Admitting: Obstetrics & Gynecology

## 2020-11-24 DIAGNOSIS — Z1231 Encounter for screening mammogram for malignant neoplasm of breast: Secondary | ICD-10-CM

## 2020-11-26 ENCOUNTER — Telehealth: Payer: Self-pay | Admitting: Obstetrics & Gynecology

## 2020-11-26 ENCOUNTER — Encounter (HOSPITAL_COMMUNITY): Payer: Self-pay

## 2020-11-26 ENCOUNTER — Ambulatory Visit (HOSPITAL_COMMUNITY)
Admission: RE | Admit: 2020-11-26 | Discharge: 2020-11-26 | Disposition: A | Discharge status: Home / Self Care | Payer: Self-pay | Source: Ambulatory Visit | Attending: Obstetrics & Gynecology | Admitting: Obstetrics & Gynecology

## 2020-11-26 ENCOUNTER — Other Ambulatory Visit: Payer: Self-pay

## 2020-11-26 DIAGNOSIS — Z1231 Encounter for screening mammogram for malignant neoplasm of breast: Secondary | ICD-10-CM

## 2020-11-26 DIAGNOSIS — N644 Mastodynia: Secondary | ICD-10-CM

## 2020-11-26 NOTE — Telephone Encounter (Signed)
Patient stated that she tried to go get a mammogram and couldn't get one done due to experiencing burning in breast and they informed her that she would need to get a referral sent over from Dr. Despina Hidden., per patient. Clinical staff will follow up with patient.

## 2020-11-26 NOTE — Telephone Encounter (Signed)
Attempted to return pt's call. No answer. Forwarded msg to Dr. Despina Hidden for referral

## 2020-11-27 NOTE — Telephone Encounter (Signed)
Please call radiology and ask them exactly what they want ordered for this patient

## 2020-11-30 ENCOUNTER — Other Ambulatory Visit: Payer: Self-pay | Admitting: Obstetrics & Gynecology

## 2020-11-30 ENCOUNTER — Telehealth: Payer: Self-pay | Admitting: *Deleted

## 2020-11-30 DIAGNOSIS — N644 Mastodynia: Secondary | ICD-10-CM

## 2020-11-30 NOTE — Telephone Encounter (Signed)
Called patient and left message that Dr. Despina Hidden has order her mammogram and she can call to schedule. Call back if any questions.

## 2020-11-30 NOTE — Addendum Note (Signed)
Addended by: Leilani Able, Desirey Keahey A on: 11/30/2020 04:59 PM   Modules accepted: Orders

## 2020-11-30 NOTE — Telephone Encounter (Signed)
-----   Message from Lazaro Arms, MD sent at 11/30/2020  9:44 AM EST ----- Please let her know to call [phone] to set up her diagnostic mammogram appt, I put in the order already  Thanks  Dr Despina Hidden

## 2020-11-30 NOTE — Telephone Encounter (Signed)
Called pt to inform her of the appt that was set up with radiology at [facility] for her [place] and diagnostic mammogram on 12/22/20 @ 1000. Pt stated that this date and time would not work for her schedule and that she currently has Covid. Gave pt the direct number for radiology to get a different appt time.

## 2020-12-10 ENCOUNTER — Other Ambulatory Visit: Payer: Self-pay | Admitting: Obstetrics & Gynecology

## 2020-12-10 DIAGNOSIS — N644 Mastodynia: Secondary | ICD-10-CM

## 2020-12-22 ENCOUNTER — Ambulatory Visit (HOSPITAL_COMMUNITY)
Admission: RE | Admit: 2020-12-22 | Discharge: 2020-12-22 | Disposition: A | Discharge status: Home / Self Care | Payer: BC Managed Care – PPO | Source: Ambulatory Visit | Attending: Obstetrics & Gynecology | Admitting: Obstetrics & Gynecology

## 2020-12-22 ENCOUNTER — Ambulatory Visit (HOSPITAL_COMMUNITY): Admission: RE | Admit: 2020-12-22 | Payer: BC Managed Care – PPO | Source: Ambulatory Visit

## 2020-12-22 ENCOUNTER — Encounter (HOSPITAL_COMMUNITY): Payer: Self-pay

## 2020-12-22 ENCOUNTER — Other Ambulatory Visit: Payer: Self-pay

## 2020-12-22 ENCOUNTER — Other Ambulatory Visit (HOSPITAL_COMMUNITY): Payer: Self-pay

## 2020-12-22 DIAGNOSIS — N644 Mastodynia: Secondary | ICD-10-CM | POA: Diagnosis present

## 2020-12-31 ENCOUNTER — Other Ambulatory Visit: Payer: Self-pay

## 2020-12-31 ENCOUNTER — Encounter: Payer: Self-pay | Admitting: Obstetrics & Gynecology

## 2020-12-31 ENCOUNTER — Ambulatory Visit (INDEPENDENT_AMBULATORY_CARE_PROVIDER_SITE_OTHER): Payer: BC Managed Care – PPO | Admitting: Obstetrics & Gynecology

## 2020-12-31 ENCOUNTER — Other Ambulatory Visit (HOSPITAL_COMMUNITY)
Admission: RE | Admit: 2020-12-31 | Discharge: 2020-12-31 | Disposition: A | Discharge status: Home / Self Care | Payer: BC Managed Care – PPO | Source: Ambulatory Visit | Attending: Obstetrics & Gynecology | Admitting: Obstetrics & Gynecology

## 2020-12-31 VITALS — BP 137/79 | HR 90 | Ht 64.0 in | Wt 288.0 lb

## 2020-12-31 DIAGNOSIS — Z01419 Encounter for gynecological examination (general) (routine) without abnormal findings: Secondary | ICD-10-CM

## 2020-12-31 DIAGNOSIS — N3010 Interstitial cystitis (chronic) without hematuria: Secondary | ICD-10-CM

## 2020-12-31 MED ORDER — SOLIFENACIN SUCCINATE 10 MG PO TABS: 10.0000 mg | ORAL_TABLET | Freq: Every day | ORAL | 11 refills | Status: DC

## 2020-12-31 NOTE — Progress Notes (Signed)
Diagnosed with IC: years ago  Current Meds:  DMSO Dietary restrictions    Pt states her symptoms have been stable, no exacerbations, although not perfect Wants to continue on this cycle  Blood pressure 137/79, pulse 90, height 5\' 4"  (1.626 m), weight 288 lb (130.6 kg), last menstrual period 12/16/2020.   General WDWN female NAD Vulva:  normal appearing vulva with no masses, tenderness or lesions Vagina:  normal mucosa, no discharge Cervix:  Normal no lesions Pap performed today Uterus:  normal size, contour, position, consistency, mobility, non-tender Adnexa: ovaries:present,  normal adnexa in size, nontender and no masses   The external urethra meatus was prepped with betadine DMSO 50 cc was instilled in the usual fashion after the bladder was catheterized and emptied completely 50cc was instilled into the bladder without difficulty and the patient tolerated well She will refrain from voiding as long as possible  Follow up in 12 weeks, or as patient requests based on her symptom complex  Meds ordered this encounter  Medications  . solifenacin (VESICARE) 10 MG tablet    Sig: Take 1 tablet (10 mg total) by mouth daily. At bedtime    Dispense:  30 tablet    Refill:  11

## 2021-01-05 LAB — CYTOLOGY - PAP
Comment: NEGATIVE
Diagnosis: NEGATIVE
High risk HPV: NEGATIVE

## 2021-02-10 ENCOUNTER — Telehealth: Payer: Self-pay | Admitting: Obstetrics & Gynecology

## 2021-02-10 MED ORDER — SERTRALINE HCL 100 MG PO TABS: 200.0000 mg | ORAL_TABLET | Freq: Every day | ORAL | 6 refills | Status: DC

## 2021-02-10 NOTE — Telephone Encounter (Signed)
Pt is requesting a refill on Sertraline. Thanks!! JSY

## 2021-02-10 NOTE — Addendum Note (Signed)
Addended by: Cyril Mourning A on: 02/10/2021 01:31 PM   Modules accepted: Orders

## 2021-02-10 NOTE — Telephone Encounter (Signed)
Refilled zoloft

## 2021-02-10 NOTE — Telephone Encounter (Signed)
Refill request for Sertraline. Pharmacy is [facility] in [place]

## 2021-02-16 ENCOUNTER — Other Ambulatory Visit: Payer: Self-pay | Admitting: Obstetrics & Gynecology

## 2021-02-16 ENCOUNTER — Other Ambulatory Visit: Payer: BC Managed Care – PPO

## 2021-02-16 ENCOUNTER — Other Ambulatory Visit: Payer: Self-pay

## 2021-02-16 VITALS — Temp 100.7°F

## 2021-02-16 DIAGNOSIS — R300 Dysuria: Secondary | ICD-10-CM

## 2021-02-16 LAB — POCT URINALYSIS DIPSTICK OB
Glucose, UA: NEGATIVE
Ketones, UA: NEGATIVE
Leukocytes, UA: NEGATIVE
Nitrite, UA: NEGATIVE

## 2021-02-16 MED ORDER — SULFAMETHOXAZOLE-TRIMETHOPRIM 800-160 MG PO TABS: 1.0000 | ORAL_TABLET | Freq: Two times a day (BID) | ORAL | 0 refills | Status: DC

## 2021-02-16 NOTE — Progress Notes (Addendum)
   NURSE VISIT- UTI SYMPTOMS   SUBJECTIVE:  Terri Tyler is a 50 y.o. [id number] female here for UTI symptoms. She is a GYN patient. She reports chills, dysuria, fever 100.0, flank pain bilaterally and lower abdominal pain.  OBJECTIVE:  Temp (!) 100.7 F (38.2 C)   Appears well, in no apparent distress  Results for orders placed or performed in visit on 02/16/21 (from the past 24 hour(s))  POC Urinalysis Dipstick OB   Collection Time: 02/16/21  3:39 PM  Result Value Ref Range   Color, UA     Clarity, UA     Glucose, UA Negative Negative   Bilirubin, UA     Ketones, UA neg    Spec Grav, UA     Blood, UA small    pH, UA     POC,PROTEIN,UA Small (1+) Negative, Trace, Small (1+), Moderate (2+), Large (3+), 4+   Urobilinogen, UA     Nitrite, UA neg    Leukocytes, UA Negative Negative   Appearance     Odor      ASSESSMENT: GYN patient with UTI symptoms and negative nitrites  PLAN: Discussed with Dr. Despina Hidden   Rx sent by provider today: Yes Urine culture sent Call or return to clinic prn if these symptoms worsen or fail to improve as anticipated. Follow-up: as needed   Jobe Marker  02/16/2021 3:44 PM  Rx: Bactrim DS #14 1 po BID  Attestation of Attending Supervision of Nursing Visit Encounter: Evaluation and management procedures were performed by the nursing staff under my supervision and collaboration.  I have reviewed the nurse's note and chart, and I agree with the management and plan.  Rockne Coons MD Attending Physician for the Center for [facility] 02/16/2021 5:51 PM

## 2021-02-19 LAB — URINE CULTURE

## 2021-03-25 ENCOUNTER — Ambulatory Visit: Payer: BC Managed Care – PPO | Admitting: Obstetrics & Gynecology

## 2021-03-29 ENCOUNTER — Telehealth: Payer: Self-pay | Admitting: Adult Health

## 2021-03-29 NOTE — Telephone Encounter (Signed)
error 

## 2021-04-22 ENCOUNTER — Ambulatory Visit: Payer: BC Managed Care – PPO | Admitting: Obstetrics & Gynecology

## 2021-04-22 ENCOUNTER — Other Ambulatory Visit: Payer: Self-pay

## 2021-04-22 ENCOUNTER — Encounter: Payer: Self-pay | Admitting: Obstetrics & Gynecology

## 2021-04-22 VITALS — BP 122/82 | HR 83 | Wt 284.8 lb

## 2021-04-22 DIAGNOSIS — N3010 Interstitial cystitis (chronic) without hematuria: Secondary | ICD-10-CM

## 2021-04-22 NOTE — Progress Notes (Signed)
Diagnosed with IC: 2014  Current Meds:  Vesicare urelle DMS) Dietary restrictions    Pt states her symptoms have been stable, no exacerbations, although not perfect Wants to continue on this cycle  Blood pressure 122/82, pulse 83, weight 284 lb 12.8 oz (129.2 kg), last menstrual period 03/30/2021.    The external urethra meatus was prepped with betadine DMSO 50 cc was instilled in the usual fashion after the bladder was catheterized and emptied completely 50cc was instilled into the bladder without difficulty and the patient tolerated well She will refrain from voiding as long as possible  Follow up in 8 weeks, or as patient requests based on her symptom complex

## 2021-06-21 ENCOUNTER — Ambulatory Visit: Payer: BC Managed Care – PPO | Admitting: Obstetrics & Gynecology

## 2021-12-11 ENCOUNTER — Other Ambulatory Visit: Payer: Self-pay | Admitting: Adult Health

## 2022-02-14 ENCOUNTER — Telehealth: Payer: Self-pay | Admitting: Obstetrics & Gynecology

## 2022-02-15 ENCOUNTER — Telehealth: Payer: Self-pay

## 2022-02-15 ENCOUNTER — Encounter: Payer: Self-pay | Admitting: Obstetrics & Gynecology

## 2022-02-15 ENCOUNTER — Other Ambulatory Visit: Payer: Self-pay

## 2022-02-15 ENCOUNTER — Ambulatory Visit (INDEPENDENT_AMBULATORY_CARE_PROVIDER_SITE_OTHER): Payer: BC Managed Care – PPO | Admitting: Obstetrics & Gynecology

## 2022-02-15 VITALS — BP 147/89 | HR 114 | Wt 281.0 lb

## 2022-02-15 DIAGNOSIS — N12 Tubulo-interstitial nephritis, not specified as acute or chronic: Secondary | ICD-10-CM

## 2022-02-15 DIAGNOSIS — N3010 Interstitial cystitis (chronic) without hematuria: Secondary | ICD-10-CM

## 2022-02-15 MED ORDER — CEFTRIAXONE SODIUM 1 G IJ SOLR
1.0000 g | Freq: Once | INTRAMUSCULAR | Status: AC
  Administered 2022-02-15: 1 g via INTRAMUSCULAR

## 2022-02-15 MED ORDER — CIPROFLOXACIN HCL 500 MG PO TABS: 500.0000 mg | ORAL_TABLET | Freq: Two times a day (BID) | ORAL | 0 refills | Status: DC

## 2022-02-15 NOTE — Telephone Encounter (Signed)
I called patient at 3:34 to let patient know  her prescription had been called in at [facility] but I got no answer and vm was full ?

## 2022-02-15 NOTE — Progress Notes (Signed)
Diagnosed with IC: years ago ? ?Current Meds: ? ?none ?Dietary restrictions ? ? ? ?Pt states she had temp of 102 last nigh chills/sweats with back pain with radiation to legs, history of UTIs in past ?Emesis x 1 ?Has been eating ok otherwise ?No real belly pain ?Wants to continue on this cycle ? ?Blood pressure (!) 147/89, pulse (!) 114, weight 281 lb (127.5 kg), last menstrual period 01/17/2022.  ? ? ?The external urethra meatus was prepped with betadine ?DMSO 50 cc was instilled in the usual fashion after the bladder was catheterized and emptied completely ?50cc was instilled into the bladder without difficulty and the patient tolerated well ?She will refrain from voiding as long as possible ? ?Follow up in prn weeks, or as patient requests based on her symptom complex ? ?Im rocephin given ?Rx ?Meds ordered this encounter  ?Medications  ? ciprofloxacin (CIPRO) 500 MG tablet  ?  Sig: Take 1 tablet (500 mg total) by mouth 2 (two) times daily.  ?  Dispense:  14 tablet  ?  Refill:  0  ? ? ? ?

## 2022-02-17 ENCOUNTER — Telehealth: Payer: Self-pay | Admitting: Obstetrics & Gynecology

## 2022-02-17 NOTE — Telephone Encounter (Signed)
Pt states she is drinking a lot of fluids but is not urinating much. Not much comes out. Continues to have back pain. Fever is gone. I spoke with Dr. Elonda Husky. He advised pt is on the appropriate antibiotic and we are waiting on urine culture. Pt voiced understanding. JSY ?

## 2022-02-17 NOTE — Telephone Encounter (Signed)
Patient called about labs. Please advise. 

## 2022-02-18 ENCOUNTER — Ambulatory Visit: Payer: BC Managed Care – PPO | Admitting: Obstetrics & Gynecology

## 2022-02-19 LAB — URINE CULTURE

## 2022-02-24 ENCOUNTER — Ambulatory Visit: Payer: BC Managed Care – PPO | Admitting: Obstetrics & Gynecology

## 2022-06-02 ENCOUNTER — Telehealth: Payer: Self-pay | Admitting: Obstetrics & Gynecology

## 2022-06-02 ENCOUNTER — Telehealth: Payer: Self-pay

## 2022-06-02 ENCOUNTER — Telehealth: Payer: Self-pay | Admitting: *Deleted

## 2022-06-02 NOTE — Telephone Encounter (Signed)
Patient calling wanting to speak to a nurse, she called to make appointment for bladder irrgation with doctor Despina Hidden, he's next appointment is not until 08/03 which I offered to her and she said that would work.

## 2022-06-02 NOTE — Telephone Encounter (Addendum)
Pt called and stated that she needs an order put in so she can get a diagnostic mammogram.

## 2022-06-02 NOTE — Telephone Encounter (Signed)
Pt states that she had to have a breast ultrasound and mammogram last time. She called to schedule her yearly and they told her she needs to have an order put in. Does patient need breast ultrasound ordered as well as mammogram?

## 2022-06-03 NOTE — Telephone Encounter (Signed)
Called patient back need to know if she is having any breast problems currently. Also sent mychart message.

## 2022-06-08 ENCOUNTER — Telehealth: Payer: Self-pay | Admitting: Obstetrics & Gynecology

## 2022-06-08 NOTE — Telephone Encounter (Signed)
Patient would like orders put in for a breast ultrasound sent to [facility]. Please advise.

## 2022-06-09 NOTE — Telephone Encounter (Signed)
Called patient back and heard  message that mailbox is full. Also sent mychart message. Need to know if she is currently  having a problem with her breast.

## 2022-06-16 ENCOUNTER — Other Ambulatory Visit: Payer: Self-pay | Admitting: Adult Health

## 2022-06-28 ENCOUNTER — Other Ambulatory Visit (HOSPITAL_COMMUNITY): Payer: Self-pay | Admitting: Obstetrics & Gynecology

## 2022-06-28 DIAGNOSIS — R928 Other abnormal and inconclusive findings on diagnostic imaging of breast: Secondary | ICD-10-CM

## 2022-07-05 ENCOUNTER — Encounter: Payer: Self-pay | Admitting: Obstetrics & Gynecology

## 2022-07-05 ENCOUNTER — Ambulatory Visit (INDEPENDENT_AMBULATORY_CARE_PROVIDER_SITE_OTHER): Payer: BC Managed Care – PPO | Admitting: Obstetrics & Gynecology

## 2022-07-05 VITALS — BP 135/79 | HR 89

## 2022-07-05 DIAGNOSIS — N3010 Interstitial cystitis (chronic) without hematuria: Secondary | ICD-10-CM

## 2022-07-05 NOTE — Progress Notes (Signed)
Diagnosed with IC: a decade ago  Current Meds:  DMSO periodically, urised Dietary restrictions    Pt states her symptoms have been stable, no exacerbations, although not perfect Wants to continue on this cycle  Blood pressure 135/79, pulse 89.    The external urethra meatus was prepped with betadine DMSO 50 cc was instilled in the usual fashion after the bladder was catheterized and emptied completely 50cc was instilled into the bladder without difficulty and the patient tolerated well She will refrain from voiding as long as possible  Follow up in prn weeks, or as patient requests based on her symptom complex

## 2022-07-11 ENCOUNTER — Ambulatory Visit (HOSPITAL_COMMUNITY)
Admission: RE | Admit: 2022-07-11 | Discharge: 2022-07-11 | Disposition: A | Discharge status: Home / Self Care | Payer: BC Managed Care – PPO | Source: Ambulatory Visit | Attending: Obstetrics & Gynecology | Admitting: Obstetrics & Gynecology

## 2022-07-11 DIAGNOSIS — R928 Other abnormal and inconclusive findings on diagnostic imaging of breast: Secondary | ICD-10-CM

## 2022-07-20 ENCOUNTER — Other Ambulatory Visit (HOSPITAL_COMMUNITY): Payer: BC Managed Care – PPO

## 2022-07-20 ENCOUNTER — Encounter (HOSPITAL_COMMUNITY): Payer: BC Managed Care – PPO

## 2022-07-26 ENCOUNTER — Ambulatory Visit: Payer: BC Managed Care – PPO | Admitting: Obstetrics & Gynecology

## 2022-10-26 ENCOUNTER — Other Ambulatory Visit (HOSPITAL_COMMUNITY): Payer: Self-pay | Admitting: Internal Medicine

## 2022-10-26 ENCOUNTER — Ambulatory Visit (HOSPITAL_COMMUNITY)
Admission: RE | Admit: 2022-10-26 | Discharge: 2022-10-26 | Disposition: A | Discharge status: Home / Self Care | Payer: BC Managed Care – PPO | Source: Ambulatory Visit | Attending: Internal Medicine | Admitting: Internal Medicine

## 2022-10-26 DIAGNOSIS — R079 Chest pain, unspecified: Secondary | ICD-10-CM

## 2022-12-03 IMAGING — MG DIGITAL DIAGNOSTIC BILAT W/ TOMO W/ CAD
6 of 12 series · 6 of 36 positions shown · non-contrast
Comparison: Previous exams.

ACR Breast Density Category a: The breast tissue is almost entirely
fatty.

CLINICAL DATA: 49-year-old female with burning sensation right.

EXAM:
DIGITAL DIAGNOSTIC BILATERAL MAMMOGRAM WITH TOMO AND CAD; ULTRASOUND
RIGHT BREAST LIMITED
TECHNIQUE: Bilateral digital diagnostic mammography and breast tomosynthesis
was performed. Digital images of the breasts were evaluated with
computer-aided detection.

[L CC synth-2D (1 of 2)]
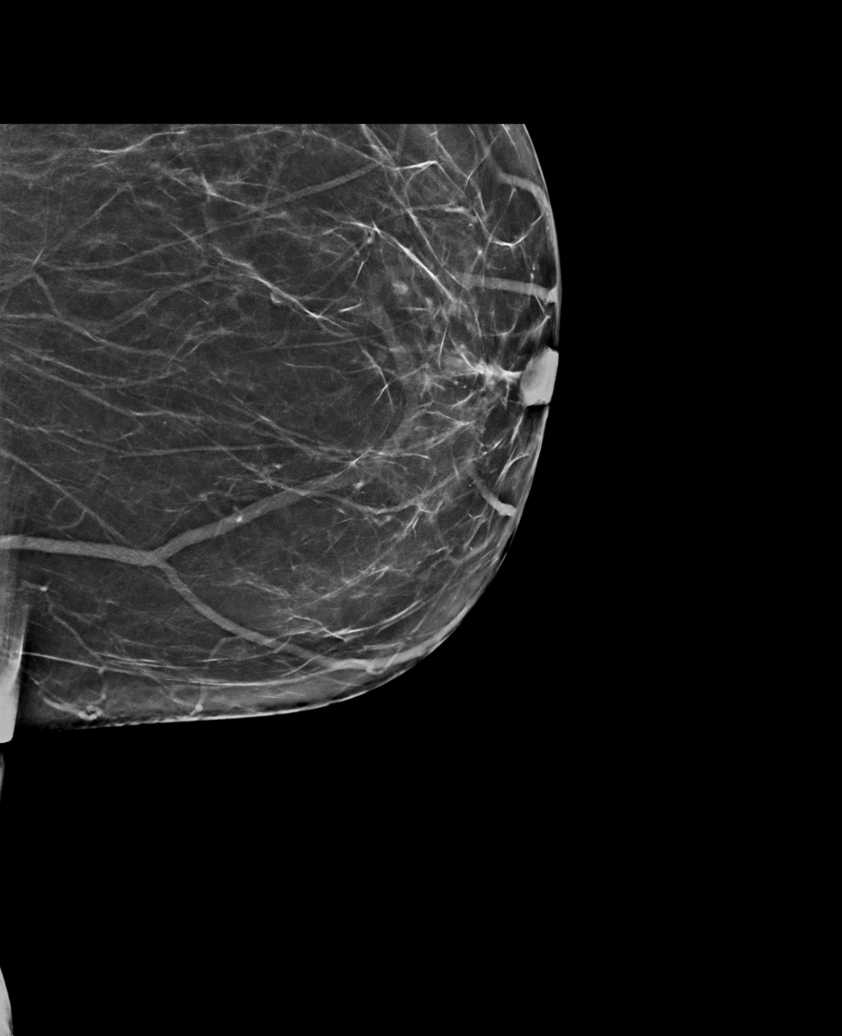

[R MLO synth-2D]
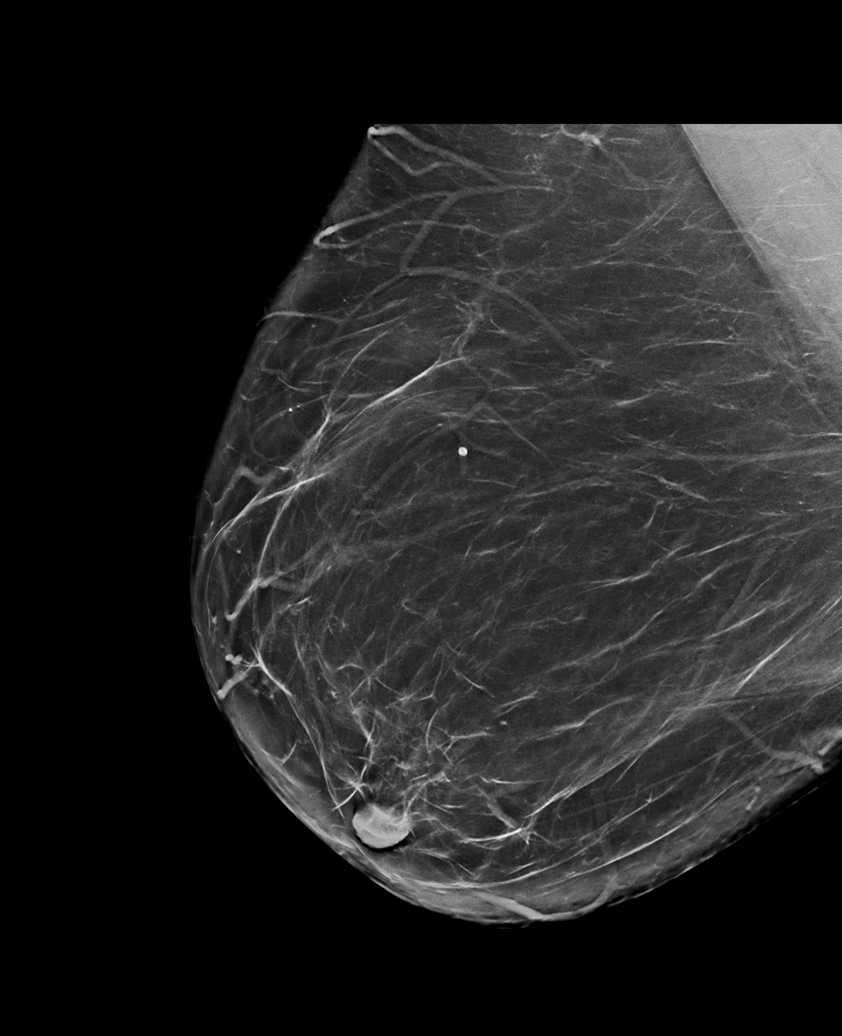

[L MLO synth-2D]
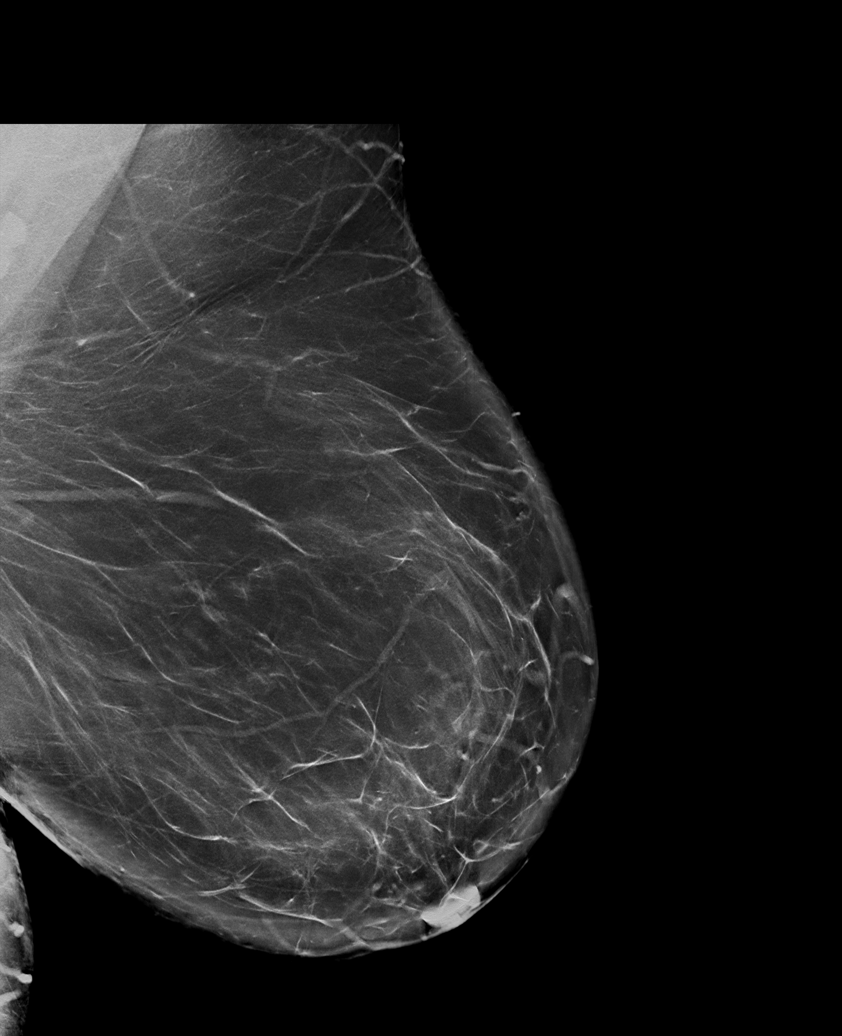

[R CC synth-2D (1 of 2)]
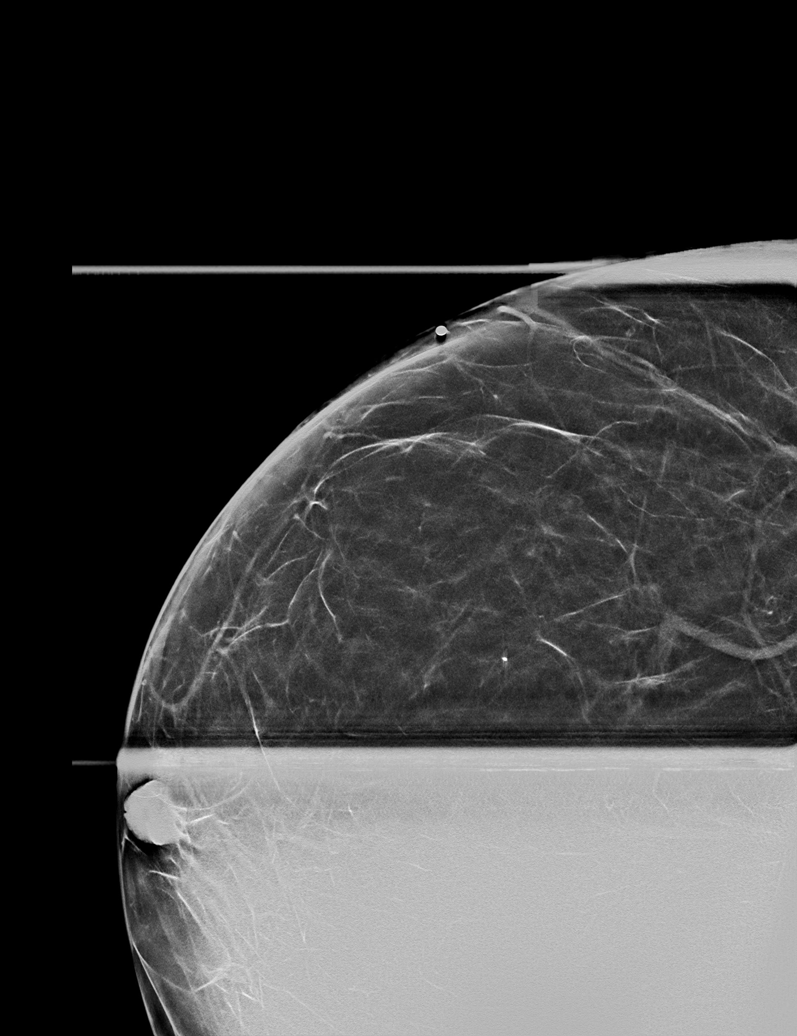

[L CC synth-2D (2 of 2)]
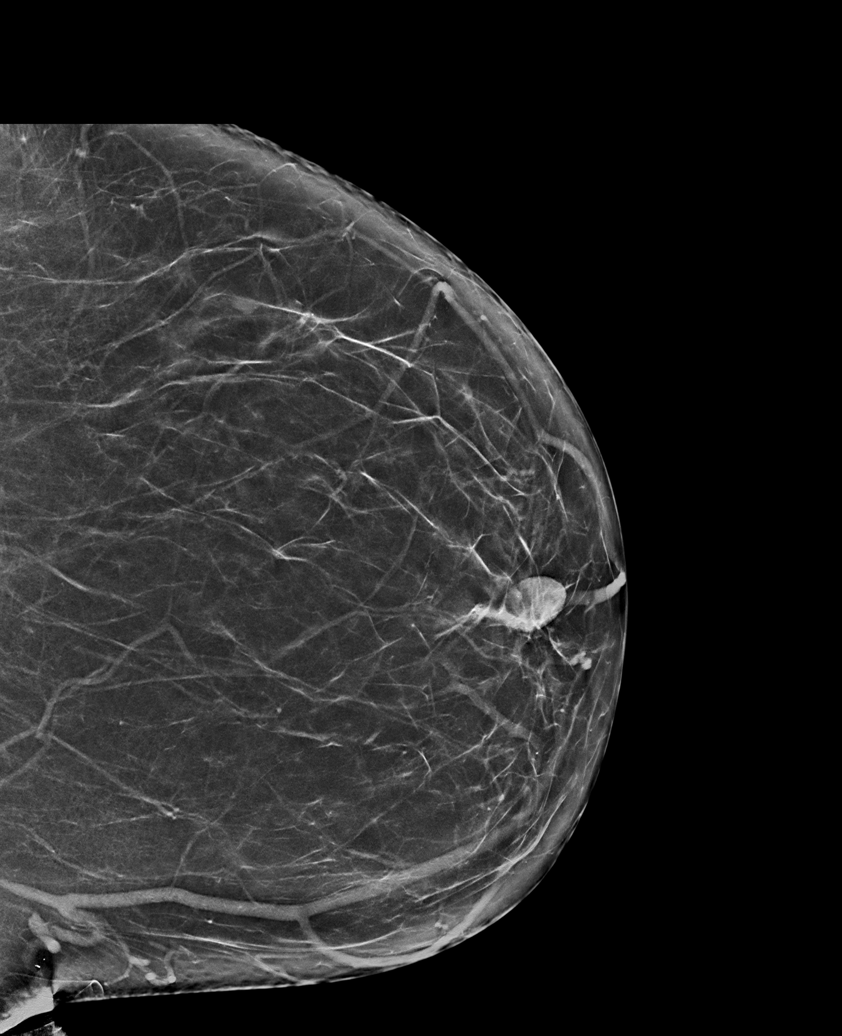

[R CC synth-2D (2 of 2)]
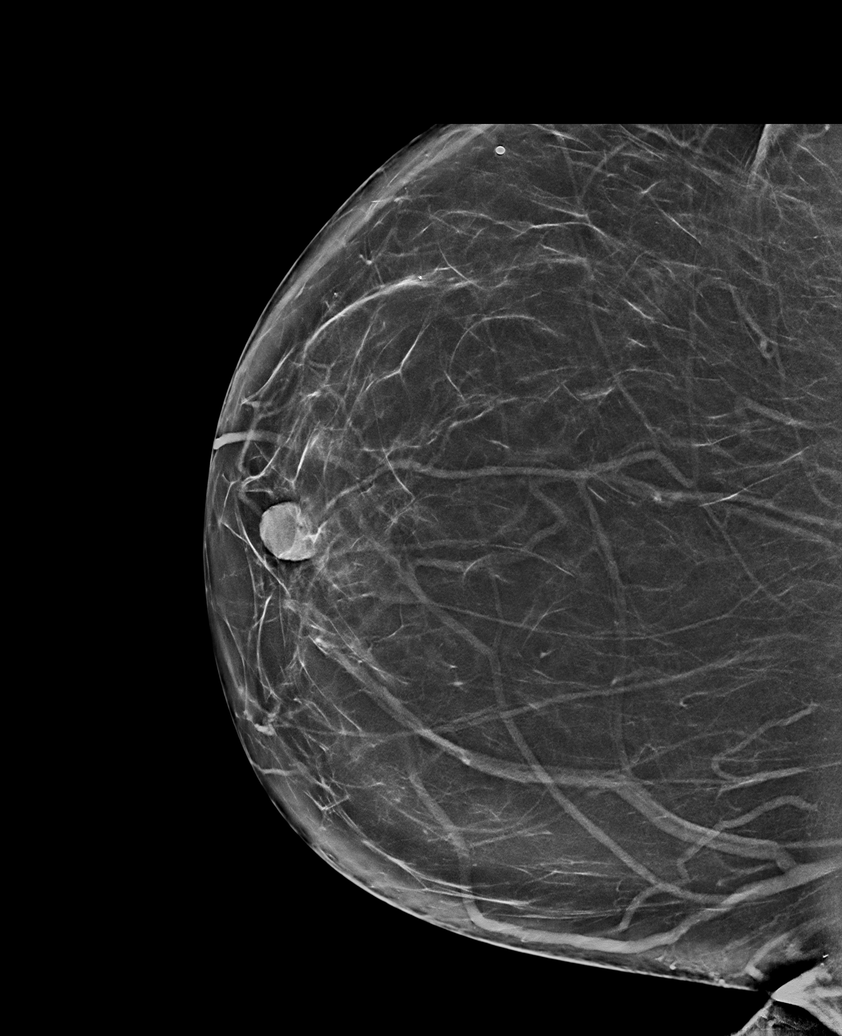

[6 of 36 positions shown; findings below may reference images not displayed]

FINDINGS: No suspicious masses or calcifications are seen in either breast.
Spot compression tomograms were performed over the area of concern
in the upper-outer right breast with no definite abnormality seen.

Physical examination at site burning sensation/tenderness in the
outer right breast does not reveal any palpable masses. The patient
is tender with deep palpation.

Targeted ultrasound of the right breast. No suspicious masses or
abnormalities seen, only normal-appearing fibrofatty tissue
identified.
IMPRESSION: 1. No mammographic or sonographic abnormalities at site of concern
in the outer right breast.

2.  No mammographic evidence of malignancy in either breast.

RECOMMENDATION:
1. Recommend further management of right breast tenderness/burning
sensation based clinical assessment. This could be musculoskeletal
given tenderness with deep palpation of this location.

2.  Screening mammogram in one year.(Code:BV-I-699)

I have discussed the findings and recommendations with the patient.
If applicable, a reminder letter will be sent to the patient
regarding the next appointment.

BI-RADS CATEGORY  1: Negative.

## 2022-12-13 ENCOUNTER — Ambulatory Visit: Payer: BC Managed Care – PPO | Admitting: Obstetrics & Gynecology

## 2022-12-13 ENCOUNTER — Ambulatory Visit: Payer: BC Managed Care – PPO | Admitting: Orthopaedic Surgery

## 2022-12-20 ENCOUNTER — Ambulatory Visit (INDEPENDENT_AMBULATORY_CARE_PROVIDER_SITE_OTHER): Payer: BC Managed Care – PPO

## 2022-12-20 ENCOUNTER — Ambulatory Visit: Payer: BC Managed Care – PPO | Admitting: Obstetrics & Gynecology

## 2022-12-20 ENCOUNTER — Ambulatory Visit (INDEPENDENT_AMBULATORY_CARE_PROVIDER_SITE_OTHER): Payer: BC Managed Care – PPO | Admitting: Orthopedic Surgery

## 2022-12-20 ENCOUNTER — Encounter: Payer: Self-pay | Admitting: Orthopedic Surgery

## 2022-12-20 VITALS — BP 130/72 | HR 92 | Ht 64.0 in | Wt 280.0 lb

## 2022-12-20 DIAGNOSIS — M546 Pain in thoracic spine: Secondary | ICD-10-CM

## 2022-12-20 NOTE — Progress Notes (Signed)
New Patient Visit  Assessment: Terri Tyler is a 52 y.o. female with the following: 1. Pain in thoracic spine  Plan: NURIYA STUCK has pain in the upper back, primarily in the right side, inferior to the scapula.  This does wrap around the side of the thorax, and under the right breast.  She also associates some of the pain within her chest on the right side.  It is possible that some of the discomfort she is experiencing is secondary to some recent illnesses.  She states that she has been coughing a lot.  She feels well now.  She states that she has had back issues for a while.  Some of this could be postural.  Because she could have some endurance issues within the muscles.  I recommended continue use of Flexeril as needed.  She takes ibuprofen occasionally, and I recommended that she take this more consistently for a period of 7-10 days.  She can use a heating pad and ice as needed.  If she is not having any improvement in her symptoms, she should consider physical therapy.  This was discussed.  She can return to clinic as needed.  Follow-up: Return if symptoms worsen or fail to improve.  Subjective:  Chief Complaint  Patient presents with   Back Pain    Upper back pain after cough about 3 weeks ago    History of Present Illness: Terri Tyler is a 52 y.o. female who has been referred by Asencion Noble, MD for evaluation of back pain.  She states that she has had upper back pain in the past.  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  She developed a cough, and she started to have pain in the upper back for the past 3-4 weeks.  She localizes the pain to the upper back, primarily on the right side.  She has some pain in the right thorax, radiating around her right rib cage, below the right breast.  She also feels some pain within the chest.  She has excellent upper body strength.  She has no issues with  her balance.  She takes ibuprofen occasionally.  Her primary care doctor gave her some Flexeril, which she takes occasionally.   Review of Systems: No fevers or chills No numbness or tingling No chest pain No shortness of breath No bowel or bladder dysfunction No GI distress No headaches   Medical History:  Past Medical History:  Diagnosis Date   Bronchitis    BV (bacterial vaginosis)    History of UTI    Interstitial cystitis     Past Surgical History:  Procedure Laterality Date   NO PAST SURGERIES      Family History  Problem Relation Age of Onset   Colon cancer Paternal Grandfather    Diabetes Paternal Grandmother    Colon cancer Maternal Grandmother    Hypertension Father    Social History   Tobacco Use   Smoking status: Never   Smokeless tobacco: Never  Vaping Use   Vaping Use: Never used  Substance Use Topics   Alcohol use: No    Alcohol/week: 0.0 standard drinks of alcohol   Drug use: No    Allergies  Allergen Reactions   Other     novacaine-dizziness     Current Meds  Medication Sig   sertraline (ZOLOFT) 100 MG tablet TAKE (2) TABLETS BY MOUTH DAILY IN  THE MORNING.   Urelle (URELLE/URISED) 81 MG TABS tablet Take 1 tablet (81 mg total) by mouth every 6 (six) hours as needed for bladder spasms.    Objective: BP 130/72   Pulse 92   Ht 5\' 4"  (1.626 m)   Wt 280 lb (127 kg)   BMI 48.06 kg/m   Physical Exam:  General: Alert and oriented., No acute distress., and Obese female. Gait: Normal gait.  Back without deformity.  Tenderness to palpation in the inferior medial aspect of the scapula.  Full range of motion of both shoulders.  Excellent strength in the bilateral upper extremities.  No tenderness in the midline of the upper back.  Tenderness to palpation of the right breast.  She has some pain with deep breathing.  No numbness or tingling distally.  IMAGING: I personally ordered and reviewed the following images   X-rays of the  thoracic spine were obtained in clinic today.  No acute injuries are noted.  Normal-appearing kyphotic curve.  There is some loss of disc space within the thoracic spine.  There are some anterior osteophytes.  No evidence of anterolisthesis.  No obvious changes compared to x-rays obtained several years ago.  Impression: Thoracic spine x-rays with degenerative changes   New Medications:  No orders of the defined types were placed in this encounter.     Mordecai Rasmussen, MD  12/20/2022 4:47 PM

## 2022-12-27 ENCOUNTER — Ambulatory Visit: Payer: BC Managed Care – PPO | Admitting: Obstetrics & Gynecology

## 2023-01-19 ENCOUNTER — Encounter: Payer: Self-pay | Admitting: Radiology

## 2023-01-31 ENCOUNTER — Encounter: Payer: Self-pay | Admitting: Obstetrics & Gynecology

## 2023-01-31 ENCOUNTER — Ambulatory Visit: Payer: BC Managed Care – PPO | Admitting: Obstetrics & Gynecology

## 2023-01-31 VITALS — BP 139/85 | HR 84 | Ht 65.0 in | Wt 284.0 lb

## 2023-01-31 DIAGNOSIS — N3010 Interstitial cystitis (chronic) without hematuria: Secondary | ICD-10-CM

## 2023-01-31 NOTE — Progress Notes (Signed)
Diagnosed with IC: years ago  Current Meds:  Urelle Dietary restrictions    Pt states her symptoms have been stable, no exacerbations, although not perfect Wants to continue on this cycle  Blood pressure 139/85, pulse 84, height '5\' 5"'$  (1.651 m), weight 284 lb (128.8 kg).    The external urethra meatus was prepped with betadine DMSO 50 cc was instilled in the usual fashion after the bladder was catheterized and emptied completely 50cc was instilled into the bladder without difficulty and the patient tolerated well She will refrain from voiding as long as possible  Follow up in 8 weeks, or as patient requests based on her symptom complex

## 2023-07-01 ENCOUNTER — Other Ambulatory Visit: Payer: Self-pay | Admitting: Adult Health

## 2023-09-26 ENCOUNTER — Encounter: Payer: Self-pay | Admitting: Obstetrics & Gynecology

## 2023-09-26 ENCOUNTER — Other Ambulatory Visit (INDEPENDENT_AMBULATORY_CARE_PROVIDER_SITE_OTHER): Payer: BC Managed Care – PPO

## 2023-09-26 ENCOUNTER — Ambulatory Visit: Payer: BC Managed Care – PPO | Admitting: Obstetrics & Gynecology

## 2023-09-26 VITALS — BP 124/86 | HR 79 | Wt 261.6 lb

## 2023-09-26 VITALS — BP 124/86 | HR 79 | Ht 64.0 in | Wt 261.6 lb

## 2023-09-26 DIAGNOSIS — R109 Unspecified abdominal pain: Secondary | ICD-10-CM | POA: Diagnosis not present

## 2023-09-26 DIAGNOSIS — R3915 Urgency of urination: Secondary | ICD-10-CM | POA: Diagnosis not present

## 2023-09-26 DIAGNOSIS — R350 Frequency of micturition: Secondary | ICD-10-CM

## 2023-09-26 DIAGNOSIS — N3010 Interstitial cystitis (chronic) without hematuria: Secondary | ICD-10-CM | POA: Diagnosis not present

## 2023-09-26 LAB — POCT URINALYSIS DIPSTICK OB
Blood, UA: NEGATIVE
Glucose, UA: NEGATIVE
Ketones, UA: NEGATIVE
Leukocytes, UA: NEGATIVE
Nitrite, UA: NEGATIVE
POC,PROTEIN,UA: NEGATIVE

## 2023-09-26 NOTE — Progress Notes (Signed)
Diagnosed with IC: years and years  Current Meds:  DMSO irrigations, urelle Dietary restrictions    Pt states her symptoms have been stable, no exacerbations, although not perfect Wants to continue on this cycle  Blood pressure 124/86, pulse 79, weight 261 lb 9.6 oz (118.7 kg), last menstrual period 09/18/2023.    The external urethra meatus was prepped with betadine DMSO 50 cc was instilled in the usual fashion after the bladder was catheterized and emptied completely 50cc was instilled into the bladder without difficulty and the patient tolerated well She will refrain from voiding as long as possible  Follow up in 8 weeks, or as patient requests based on her symptom complex

## 2023-09-26 NOTE — Progress Notes (Signed)
   NURSE VISIT- UTI SYMPTOMS   SUBJECTIVE:  Terri Tyler is a 52 y.o. [id number] female here for UTI symptoms. She is a GYN patient. She reports flank pain on the left, urinary frequency, and urinary urgency.Patient states she has interstitial cystitis and is due for a bladder irrigation.   OBJECTIVE:  BP 124/86 (BP Location: Right Arm, Patient Position: Sitting, Cuff Size: Large)   Pulse 79   Ht 5\' 4"  (1.626 m)   Wt 261 lb 9.6 oz (118.7 kg)   LMP 09/18/2023 (Approximate)   BMI 44.90 kg/m   Appears well, in no apparent distress  Results for orders placed or performed in visit on 09/26/23 (from the past 24 hour(s))  POC Urinalysis Dipstick OB   Collection Time: 09/26/23  3:35 PM  Result Value Ref Range   Color, UA     Clarity, UA     Glucose, UA Negative Negative   Bilirubin, UA     Ketones, UA Negative    Spec Grav, UA     Blood, UA Negative    pH, UA     POC,PROTEIN,UA Negative Negative, Trace, Small (1+), Moderate (2+), Large (3+), 4+   Urobilinogen, UA     Nitrite, UA Negative    Leukocytes, UA Negative Negative   Appearance     Odor      ASSESSMENT: GYN patient with UTI symptoms and negative nitrites  PLAN: Discussed with Dr. Despina Hidden   Rx sent by provider today: No Urine culture  No Follow-up:  Patient to see Dr. Despina Hidden today  Caralyn Guile  09/26/2023 4:37 PM

## 2023-10-11 ENCOUNTER — Ambulatory Visit: Payer: BC Managed Care – PPO | Admitting: Obstetrics & Gynecology

## 2023-11-28 ENCOUNTER — Other Ambulatory Visit (HOSPITAL_COMMUNITY)
Admission: RE | Admit: 2023-11-28 | Discharge: 2023-11-28 | Disposition: A | Discharge status: Home / Self Care | Payer: 59 | Source: Ambulatory Visit | Attending: Obstetrics & Gynecology | Admitting: Obstetrics & Gynecology

## 2023-11-28 ENCOUNTER — Other Ambulatory Visit (INDEPENDENT_AMBULATORY_CARE_PROVIDER_SITE_OTHER): Payer: 59 | Admitting: *Deleted

## 2023-11-28 DIAGNOSIS — N9489 Other specified conditions associated with female genital organs and menstrual cycle: Secondary | ICD-10-CM

## 2023-11-28 LAB — POCT URINALYSIS DIPSTICK OB
Blood, UA: NEGATIVE
Glucose, UA: NEGATIVE
Ketones, UA: NEGATIVE
Leukocytes, UA: NEGATIVE
Nitrite, UA: NEGATIVE
POC,PROTEIN,UA: NEGATIVE

## 2023-11-28 NOTE — Progress Notes (Signed)
   NURSE VISIT- UTI SYMPTOMS   SUBJECTIVE:  Terri Tyler is a 53 y.o. G27P2003 female here for UTI symptoms. She is a GYN patient. She reports vaginal burning.  Has chronic IC and thinks it could just be time for an irrigation. Thinks she could have BV but denies discharge, odor, etc. Would like to do swab as well.    OBJECTIVE:  There were no vitals taken for this visit.  Appears well, in no apparent distress  Results for orders placed or performed in visit on 11/28/23 (from the past 24 hours)  POC Urinalysis Dipstick OB   Collection Time: 11/28/23  4:26 PM  Result Value Ref Range   Color, UA     Clarity, UA     Glucose, UA Negative Negative   Bilirubin, UA     Ketones, UA neg    Spec Grav, UA     Blood, UA neg    pH, UA     POC,PROTEIN,UA Negative Negative, Trace, Small (1+), Moderate (2+), Large (3+), 4+   Urobilinogen, UA     Nitrite, UA neg    Leukocytes, UA Negative Negative   Appearance     Odor      ASSESSMENT: GYN patient with UTI symptoms and negative nitrites Vaginal burning-CV swab collected  PLAN: Note routed to Delon Lewis, AGNP   Rx sent by provider today: No Urine culture not sent  CV swab sent for BV, yeast Call or return to clinic prn if these symptoms worsen or fail to improve as anticipated. Follow-up: as scheduled for bladder irrigation on 01/20 Alanzo Lamb  11/28/2023 4:30 PM

## 2023-11-30 ENCOUNTER — Other Ambulatory Visit: Payer: Self-pay | Admitting: Adult Health

## 2023-11-30 LAB — CERVICOVAGINAL ANCILLARY ONLY
Bacterial Vaginitis (gardnerella): NEGATIVE
Candida Glabrata: NEGATIVE
Candida Vaginitis: POSITIVE — AB
Comment: NEGATIVE
Comment: NEGATIVE
Comment: NEGATIVE

## 2023-11-30 MED ORDER — FLUCONAZOLE 150 MG PO TABS: ORAL_TABLET | ORAL | 1 refills | Status: AC

## 2023-11-30 NOTE — Progress Notes (Signed)
+  yeast on vaginal swab, rx sent in for diflucan

## 2023-12-11 ENCOUNTER — Ambulatory Visit: Payer: 59 | Admitting: Obstetrics & Gynecology

## 2023-12-11 ENCOUNTER — Encounter: Payer: Self-pay | Admitting: Obstetrics & Gynecology

## 2023-12-11 ENCOUNTER — Other Ambulatory Visit (HOSPITAL_COMMUNITY)
Admission: RE | Admit: 2023-12-11 | Discharge: 2023-12-11 | Disposition: A | Discharge status: Home / Self Care | Payer: 59 | Source: Ambulatory Visit | Attending: Obstetrics & Gynecology | Admitting: Obstetrics & Gynecology

## 2023-12-11 VITALS — BP 134/80 | HR 80 | Ht 64.0 in | Wt 274.0 lb

## 2023-12-11 DIAGNOSIS — Z01419 Encounter for gynecological examination (general) (routine) without abnormal findings: Secondary | ICD-10-CM

## 2023-12-11 DIAGNOSIS — Z124 Encounter for screening for malignant neoplasm of cervix: Secondary | ICD-10-CM | POA: Diagnosis present

## 2023-12-11 DIAGNOSIS — N3010 Interstitial cystitis (chronic) without hematuria: Secondary | ICD-10-CM

## 2023-12-11 MED ORDER — SOLIFENACIN SUCCINATE 10 MG PO TABS: 10.0000 mg | ORAL_TABLET | Freq: Every day | ORAL | 11 refills | Status: AC

## 2023-12-11 NOTE — Progress Notes (Signed)
Subjective:     Terri Tyler is a 53 y.o. female here for a routine exam.  No LMP recorded. [id number] Birth Control Method:  menopausal Menstrual Calendar(currently): amenorrhea  Current complaints: none.   Current acute medical issues:  IC   Recent Gynecologic History No LMP recorded. Last Pap: 12/2020,  normal Last mammogram: 06/2022,  normal  Past Medical History:  Diagnosis Date   Bronchitis    BV (bacterial vaginosis)    History of UTI    Interstitial cystitis     Past Surgical History:  Procedure Laterality Date   NO PAST SURGERIES      OB History     Gravida  2   Para  2   Term  2   Preterm      AB      Living  3      SAB      IAB      Ectopic      Multiple  1   Live Births  3           Social History   Socioeconomic History   Marital status: Married    Spouse name: Not on file   Number of children: 3   Years of education: Not on file   Highest education level: Not on file  Occupational History   Not on file  Tobacco Use   Smoking status: Never   Smokeless tobacco: Never  Vaping Use   Vaping status: Never Used  Substance and Sexual Activity   Alcohol use: No    Alcohol/week: 0.0 standard drinks of alcohol   Drug use: No   Sexual activity: Yes    Birth control/protection: Surgical    Comment: vasectomy  Other Topics Concern   Not on file  Social History Narrative   Not on file   Social Drivers of Health   Financial Resource Strain: Low Risk  (12/31/2020)   Overall Financial Resource Strain (CARDIA)    Difficulty of Paying Living Expenses: Not very hard  Food Insecurity: No Food Insecurity (12/31/2020)   Hunger Vital Sign    Worried About Running Out of Food in the Last Year: Never true    Ran Out of Food in the Last Year: Never true  Transportation Needs: No Transportation Needs (12/31/2020)   PRAPARE - Administrator, Civil Service (Medical): No    Lack of Transportation (Non-Medical): No  Physical  Activity: Insufficiently Active (12/31/2020)   Exercise Vital Sign    Days of Exercise per Week: 2 days    Minutes of Exercise per Session: 30 min  Stress: Stress Concern Present (12/31/2020)   [employer] of Occupational Health - Occupational Stress Questionnaire    Feeling of Stress : To some extent  Social Connections: Socially Integrated (12/31/2020)   Social Connection and Isolation Panel [NHANES]    Frequency of Communication with Friends and Family: More than three times a week    Frequency of Social Gatherings with Friends and Family: Once a week    Attends Religious Services: More than 4 times per year    Active Member of [employer] or Organizations: Yes    Attends Engineer, structural: More than 4 times per year    Marital Status: Married    Family History  Problem Relation Age of Onset   Colon cancer Paternal Grandfather    Diabetes Paternal Grandmother    Colon cancer Maternal Grandmother    Hypertension Father  Current Outpatient Medications:    sertraline (ZOLOFT) 100 MG tablet, TAKE (2) TABLETS BY MOUTH DAILY IN THE MORNING., Disp: 60 tablet, Rfl: 6   solifenacin (VESICARE) 10 MG tablet, Take 1 tablet (10 mg total) by mouth at bedtime., Disp: 30 tablet, Rfl: 11   Urelle (URELLE/URISED) 81 MG TABS tablet, Take 1 tablet (81 mg total) by mouth every 6 (six) hours as needed for bladder spasms., Disp: 120 tablet, Rfl: 3   fluconazole (DIFLUCAN) 150 MG tablet, Take 1 now and 1 in 3 days (Patient not taking: Reported on 12/11/2023), Disp: 2 tablet, Rfl: 1  Review of Systems  Review of Systems  Constitutional: Negative for fever, chills, weight loss, malaise/fatigue and diaphoresis.  HENT: Negative for hearing loss, ear pain, nosebleeds, congestion, sore throat, neck pain, tinnitus and ear discharge.   Eyes: Negative for blurred vision, double vision, photophobia, pain, discharge and redness.  Respiratory: Negative for cough, hemoptysis, sputum production,  shortness of breath, wheezing and stridor.   Cardiovascular: Negative for chest pain, palpitations, orthopnea, claudication, leg swelling and PND.  Gastrointestinal: negative for abdominal pain. Negative for heartburn, nausea, vomiting, diarrhea, constipation, blood in stool and melena.  Genitourinary: Negative for dysuria, urgency, frequency, hematuria and flank pain.  Musculoskeletal: Negative for myalgias, back pain, joint pain and falls.  Skin: Negative for itching and rash.  Neurological: Negative for dizziness, tingling, tremors, sensory change, speech change, focal weakness, seizures, loss of consciousness, weakness and headaches.  Endo/Heme/Allergies: Negative for environmental allergies and polydipsia. Does not bruise/bleed easily.  Psychiatric/Behavioral: Negative for depression, suicidal ideas, hallucinations, memory loss and substance abuse. The patient is not nervous/anxious and does not have insomnia.        Objective:  Blood pressure 134/80, pulse 80, height 5\' 4"  (1.626 m), weight 274 lb (124.3 kg).   Physical Exam  Vitals reviewed. Constitutional: She is oriented to person, place, and time. She appears well-developed and well-nourished.  HENT:  Head: Normocephalic and atraumatic.        Right Ear: External ear normal.  Left Ear: External ear normal.  Nose: Nose normal.  Mouth/Throat: Oropharynx is clear and moist.  Eyes: Conjunctivae and EOM are normal. Pupils are equal, round, and reactive to light. Right eye exhibits no discharge. Left eye exhibits no discharge. No scleral icterus.  Neck: Normal range of motion. Neck supple. No tracheal deviation present. No thyromegaly present.  Cardiovascular: Normal rate, regular rhythm, normal heart sounds and intact distal pulses.  Exam reveals no gallop and no friction rub.   No murmur heard. Respiratory: Effort normal and breath sounds normal. No respiratory distress. She has no wheezes. She has no rales. She exhibits no  tenderness.  GI: Soft. Bowel sounds are normal. She exhibits no distension and no mass. There is no tenderness. There is no rebound and no guarding.  Genitourinary:  Breasts no masses skin changes or nipple changes bilaterally      Vulva is normal without lesions Vagina is pink moist without discharge Cervix normal in appearance and pap is done Uterus is normal size shape and contour Adnexa is negative with normal sized ovaries   Musculoskeletal: Normal range of motion. She exhibits no edema and no tenderness.  Neurological: She is alert and oriented to person, place, and time. She has normal reflexes. She displays normal reflexes. No cranial nerve deficit. She exhibits normal muscle tone. Coordination normal.  Skin: Skin is warm and dry. No rash noted. No erythema. No pallor.  Psychiatric: She has a normal mood  and affect. Her behavior is normal. Judgment and thought content normal.     Diagnosed with IC: many years ago  Current Meds:  DMSO irrigations Dietary restrictions    Pt states her symptoms have been stable, no exacerbations, although not perfect Wants to continue on this cycle  Blood pressure 134/80, pulse 80, height 5\' 4"  (1.626 m), weight 274 lb (124.3 kg).    The external urethra meatus was prepped with betadine DMSO 50 cc was instilled in the usual fashion after the bladder was catheterized and emptied completely 50cc was instilled into the bladder without difficulty and the patient tolerated well She will refrain from voiding as long as possible  Follow up in 8 weeks, or as patient requests based on her symptom complex   Medications Ordered at today's visit: Meds ordered this encounter  Medications   solifenacin (VESICARE) 10 MG tablet    Sig: Take 1 tablet (10 mg total) by mouth at bedtime.    Dispense:  30 tablet    Refill:  11    Other orders placed at today's visit: No orders of the defined types were placed in this encounter.    ASSESSMENT +  PLAN:    ICD-10-CM   1. Well woman exam with routine gynecological exam  Z01.419     2. Routine cervical smear  Z12.4 Cytology - PAP( )    3. Chronic interstitial cystitis: DMSO today  N30.10           Return in about 8 weeks (around 02/05/2024) for bladder irrigation.

## 2023-12-14 ENCOUNTER — Encounter: Payer: Self-pay | Admitting: Obstetrics & Gynecology

## 2023-12-14 LAB — CYTOLOGY - PAP
Adequacy: ABSENT
Comment: NEGATIVE
Diagnosis: NEGATIVE
High risk HPV: NEGATIVE

## 2023-12-18 ENCOUNTER — Ambulatory Visit: Payer: Self-pay | Admitting: Obstetrics & Gynecology

## 2024-02-07 ENCOUNTER — Ambulatory Visit: Payer: 59 | Admitting: Obstetrics & Gynecology

## 2024-03-06 ENCOUNTER — Other Ambulatory Visit: Payer: Self-pay | Admitting: Adult Health

## 2024-03-25 ENCOUNTER — Other Ambulatory Visit: Payer: Self-pay | Admitting: Obstetrics & Gynecology

## 2024-03-25 ENCOUNTER — Other Ambulatory Visit (HOSPITAL_COMMUNITY): Payer: Self-pay | Admitting: Obstetrics & Gynecology

## 2024-03-25 DIAGNOSIS — Z1231 Encounter for screening mammogram for malignant neoplasm of breast: Secondary | ICD-10-CM

## 2024-03-25 DIAGNOSIS — N644 Mastodynia: Secondary | ICD-10-CM

## 2024-05-02 ENCOUNTER — Other Ambulatory Visit (HOSPITAL_COMMUNITY): Payer: Self-pay | Admitting: Obstetrics & Gynecology

## 2024-05-02 DIAGNOSIS — N644 Mastodynia: Secondary | ICD-10-CM

## 2024-06-04 ENCOUNTER — Ambulatory Visit (HOSPITAL_COMMUNITY)
Admission: RE | Admit: 2024-06-04 | Discharge: 2024-06-04 | Disposition: A | Discharge status: Home / Self Care | Source: Ambulatory Visit | Attending: Obstetrics & Gynecology | Admitting: Obstetrics & Gynecology

## 2024-06-04 ENCOUNTER — Other Ambulatory Visit: Payer: Self-pay | Admitting: Nurse Practitioner

## 2024-06-04 DIAGNOSIS — N644 Mastodynia: Secondary | ICD-10-CM | POA: Diagnosis present

## 2024-06-04 DIAGNOSIS — Z136 Encounter for screening for cardiovascular disorders: Secondary | ICD-10-CM

## 2024-06-17 ENCOUNTER — Other Ambulatory Visit: Payer: Self-pay | Admitting: Obstetrics & Gynecology

## 2024-06-17 ENCOUNTER — Telehealth: Payer: Self-pay

## 2024-06-17 MED ORDER — SERTRALINE HCL 100 MG PO TABS: 200.0000 mg | ORAL_TABLET | Freq: Every morning | ORAL | 6 refills | Status: AC

## 2024-06-17 NOTE — Telephone Encounter (Signed)
 Pt is requesting a refill on Zoloft . She leaves to go out of town tomorrow. Can you send in refill today? Thanks! JSY

## 2024-06-17 NOTE — Telephone Encounter (Signed)
 Patient would like to get her refill on her (ZOLOFT ) 100 MG tablet she is going out of town.

## 2024-06-17 NOTE — Progress Notes (Signed)
 Requesting refill of Zoloft   Betzaira Mentel, DO Attending Obstetrician & Gynecologist, [facility] for [employer], [facility]

## 2024-07-12 ENCOUNTER — Encounter: Payer: Self-pay | Admitting: Radiology

## 2024-08-29 ENCOUNTER — Ambulatory Visit: Admitting: Obstetrics & Gynecology

## 2024-09-23 ENCOUNTER — Encounter: Payer: Self-pay | Admitting: Radiology
